# Patient Record
Sex: Female | Born: 1995 | Race: White | Hispanic: No | Marital: Single | Smoking: Former smoker
Health system: Southern US, Community
[De-identification: ages and names within clinical notes are randomized; demographics above are authoritative.]

## PROBLEM LIST (undated history)

## (undated) ENCOUNTER — Emergency Department: Admission: EM

## (undated) ENCOUNTER — Inpatient Hospital Stay: Payer: Self-pay

## (undated) DIAGNOSIS — F329 Major depressive disorder, single episode, unspecified: Secondary | ICD-10-CM

## (undated) DIAGNOSIS — O139 Gestational [pregnancy-induced] hypertension without significant proteinuria, unspecified trimester: Secondary | ICD-10-CM

## (undated) DIAGNOSIS — F32A Depression, unspecified: Secondary | ICD-10-CM

## (undated) DIAGNOSIS — D649 Anemia, unspecified: Secondary | ICD-10-CM

## (undated) HISTORY — PX: NO PAST SURGERIES: SHX2092

---
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
## 2015-08-04 NOTE — L&D Delivery Note (Signed)
Delivery Note At 2233 on 07/01/16  a viable and female  was delivered via  SVD meconium staining noted  And infant was vigorous at birth  (Presentation:ROA ;  ).  APGAR8/9: , ; weight  .   Placenta status:intact and meconium stained  , .  Cord:  dalayed clamping  with the following complications: None.    Anesthesia:CLE   Episiotomy:  none Lacerations:  Anterior vaginal lac 2 cm  Suture Repair: 3.0 vicryl Est. Blood Loss (mL):  200 cc  Mom to postpartum.  Baby to Couplet care / Skin to Skin.  Fuad Forget 07/01/2016, 10:43 PM

## 2016-04-15 ENCOUNTER — Observation Stay: Payer: No Typology Code available for payment source

## 2016-04-15 ENCOUNTER — Observation Stay
Admission: EM | Admit: 2016-04-15 | Discharge: 2016-04-15 | Disposition: A | Discharge status: Home / Self Care | Payer: No Typology Code available for payment source | Attending: Obstetrics and Gynecology | Admitting: Obstetrics and Gynecology

## 2016-04-15 DIAGNOSIS — O0933 Supervision of pregnancy with insufficient antenatal care, third trimester: Secondary | ICD-10-CM | POA: Insufficient documentation

## 2016-04-15 DIAGNOSIS — Z3A28 28 weeks gestation of pregnancy: Secondary | ICD-10-CM | POA: Diagnosis not present

## 2016-04-15 DIAGNOSIS — O99333 Smoking (tobacco) complicating pregnancy, third trimester: Secondary | ICD-10-CM | POA: Insufficient documentation

## 2016-04-15 DIAGNOSIS — O0932 Supervision of pregnancy with insufficient antenatal care, second trimester: Secondary | ICD-10-CM

## 2016-04-15 DIAGNOSIS — Z3490 Encounter for supervision of normal pregnancy, unspecified, unspecified trimester: Secondary | ICD-10-CM

## 2016-04-15 LAB — DIFFERENTIAL
BASOS ABS: 0 10*3/uL (ref 0–0.1)
BASOS PCT: 0 %
EOS ABS: 0.1 10*3/uL (ref 0–0.7)
Eosinophils Relative: 1 %
Lymphocytes Relative: 17 %
Lymphs Abs: 2.2 10*3/uL (ref 1.0–3.6)
MONO ABS: 0.9 10*3/uL (ref 0.2–0.9)
MONOS PCT: 7 %
NEUTROS ABS: 10 10*3/uL — AB (ref 1.4–6.5)
Neutrophils Relative %: 75 %

## 2016-04-15 LAB — URINALYSIS COMPLETE WITH MICROSCOPIC (ARMC ONLY)
BACTERIA UA: NONE SEEN
Bilirubin Urine: NEGATIVE
Glucose, UA: NEGATIVE mg/dL
HGB URINE DIPSTICK: NEGATIVE
Leukocytes, UA: NEGATIVE
NITRITE: NEGATIVE
PH: 7 (ref 5.0–8.0)
PROTEIN: NEGATIVE mg/dL
SPECIFIC GRAVITY, URINE: 1.015 (ref 1.005–1.030)

## 2016-04-15 LAB — CBC
HCT: 33.3 % — ABNORMAL LOW (ref 35.0–47.0)
Hemoglobin: 11.3 g/dL — ABNORMAL LOW (ref 12.0–16.0)
MCH: 31.3 pg (ref 26.0–34.0)
MCHC: 33.9 g/dL (ref 32.0–36.0)
MCV: 92.2 fL (ref 80.0–100.0)
PLATELETS: 234 10*3/uL (ref 150–440)
RBC: 3.61 MIL/uL — ABNORMAL LOW (ref 3.80–5.20)
RDW: 12.6 % (ref 11.5–14.5)
WBC: 13.2 10*3/uL — AB (ref 3.6–11.0)

## 2016-04-15 LAB — TYPE AND SCREEN
ABO/RH(D): AB POS
Antibody Screen: NEGATIVE

## 2016-04-15 LAB — URINE DRUG SCREEN, QUALITATIVE (ARMC ONLY)
Amphetamines, Ur Screen: NOT DETECTED
BARBITURATES, UR SCREEN: NOT DETECTED
BENZODIAZEPINE, UR SCRN: NOT DETECTED
CANNABINOID 50 NG, UR ~~LOC~~: POSITIVE — AB
Cocaine Metabolite,Ur ~~LOC~~: NOT DETECTED
MDMA (ECSTASY) UR SCREEN: NOT DETECTED
Methadone Scn, Ur: NOT DETECTED
Opiate, Ur Screen: NOT DETECTED
PHENCYCLIDINE (PCP) UR S: NOT DETECTED
TRICYCLIC, UR SCREEN: NOT DETECTED

## 2016-04-15 LAB — RAPID HIV SCREEN (HIV 1/2 AB+AG)
HIV 1/2 Antibodies: NONREACTIVE
HIV-1 P24 ANTIGEN - HIV24: NONREACTIVE

## 2016-04-15 LAB — OB RESULTS CONSOLE HIV ANTIBODY (ROUTINE TESTING): HIV: NONREACTIVE

## 2016-04-15 LAB — FETAL FIBRONECTIN: FETAL FIBRONECTIN: NEGATIVE
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
## 2016-04-15 NOTE — Discharge Instructions (Signed)
Fetal Movement Counts °Patient Name: __________________________________________________ Patient Due Date: ____________________ °Performing a fetal movement count is highly recommended in high-risk pregnancies, but it is good for every pregnant woman to do. Your health care provider may ask you to start counting fetal movements at 28 weeks of the pregnancy. Fetal movements often increase: °· After eating a full meal. °· After physical activity. °· After eating or drinking something sweet or cold. °· At rest. °Pay attention to when you feel the baby is most active. This will help you notice a pattern of your baby's sleep and wake cycles and what factors contribute to an increase in fetal movement. It is important to perform a fetal movement count at the same time each day when your baby is normally most active.  °HOW TO COUNT FETAL MOVEMENTS °1. Find a quiet and comfortable area to sit or lie down on your left side. Lying on your left side provides the best blood and oxygen circulation to your baby. °2. Write down the day and time on a sheet of paper or in a journal. °3. Start counting kicks, flutters, swishes, rolls, or jabs in a 2-hour period. You should feel at least 10 movements within 2 hours. °4. If you do not feel 10 movements in 2 hours, wait 2-3 hours and count again. Look for a change in the pattern or not enough counts in 2 hours. °SEEK MEDICAL CARE IF: °· You feel less than 10 counts in 2 hours, tried twice. °· There is no movement in over an hour. °· The pattern is changing or taking longer each day to reach 10 counts in 2 hours. °· You feel the baby is not moving as he or she usually does. °Date: ____________ Movements: ____________ Start time: ____________ Finish time: ____________  °Date: ____________ Movements: ____________ Start time: ____________ Finish time: ____________ °Date: ____________ Movements: ____________ Start time: ____________ Finish time: ____________ °Date: ____________ Movements:  ____________ Start time: ____________ Finish time: ____________ °Date: ____________ Movements: ____________ Start time: ____________ Finish time: ____________ °Date: ____________ Movements: ____________ Start time: ____________ Finish time: ____________ °Date: ____________ Movements: ____________ Start time: ____________ Finish time: ____________ °Date: ____________ Movements: ____________ Start time: ____________ Finish time: ____________  °Date: ____________ Movements: ____________ Start time: ____________ Finish time: ____________ °Date: ____________ Movements: ____________ Start time: ____________ Finish time: ____________ °Date: ____________ Movements: ____________ Start time: ____________ Finish time: ____________ °Date: ____________ Movements: ____________ Start time: ____________ Finish time: ____________ °Date: ____________ Movements: ____________ Start time: ____________ Finish time: ____________ °Date: ____________ Movements: ____________ Start time: ____________ Finish time: ____________ °Date: ____________ Movements: ____________ Start time: ____________ Finish time: ____________  °Date: ____________ Movements: ____________ Start time: ____________ Finish time: ____________ °Date: ____________ Movements: ____________ Start time: ____________ Finish time: ____________ °Date: ____________ Movements: ____________ Start time: ____________ Finish time: ____________ °Date: ____________ Movements: ____________ Start time: ____________ Finish time: ____________ °Date: ____________ Movements: ____________ Start time: ____________ Finish time: ____________ °Date: ____________ Movements: ____________ Start time: ____________ Finish time: ____________ °Date: ____________ Movements: ____________ Start time: ____________ Finish time: ____________  °Date: ____________ Movements: ____________ Start time: ____________ Finish time: ____________ °Date: ____________ Movements: ____________ Start time: ____________ Finish  time: ____________ °Date: ____________ Movements: ____________ Start time: ____________ Finish time: ____________ °Date: ____________ Movements: ____________ Start time: ____________ Finish time: ____________ °Date: ____________ Movements: ____________ Start time: ____________ Finish time: ____________ °Date: ____________ Movements: ____________ Start time: ____________ Finish time: ____________ °Date: ____________ Movements: ____________ Start time: ____________ Finish time: ____________  °Date: ____________ Movements: ____________ Start time: ____________ Finish   time: ____________ °Date: ____________ Movements: ____________ Start time: ____________ Finish time: ____________ °Date: ____________ Movements: ____________ Start time: ____________ Finish time: ____________ °Date: ____________ Movements: ____________ Start time: ____________ Finish time: ____________ °Date: ____________ Movements: ____________ Start time: ____________ Finish time: ____________ °Date: ____________ Movements: ____________ Start time: ____________ Finish time: ____________ °Date: ____________ Movements: ____________ Start time: ____________ Finish time: ____________  °Date: ____________ Movements: ____________ Start time: ____________ Finish time: ____________ °Date: ____________ Movements: ____________ Start time: ____________ Finish time: ____________ °Date: ____________ Movements: ____________ Start time: ____________ Finish time: ____________ °Date: ____________ Movements: ____________ Start time: ____________ Finish time: ____________ °Date: ____________ Movements: ____________ Start time: ____________ Finish time: ____________ °Date: ____________ Movements: ____________ Start time: ____________ Finish time: ____________ °Date: ____________ Movements: ____________ Start time: ____________ Finish time: ____________  °Date: ____________ Movements: ____________ Start time: ____________ Finish time: ____________ °Date: ____________  Movements: ____________ Start time: ____________ Finish time: ____________ °Date: ____________ Movements: ____________ Start time: ____________ Finish time: ____________ °Date: ____________ Movements: ____________ Start time: ____________ Finish time: ____________ °Date: ____________ Movements: ____________ Start time: ____________ Finish time: ____________ °Date: ____________ Movements: ____________ Start time: ____________ Finish time: ____________ °Date: ____________ Movements: ____________ Start time: ____________ Finish time: ____________  °Date: ____________ Movements: ____________ Start time: ____________ Finish time: ____________ °Date: ____________ Movements: ____________ Start time: ____________ Finish time: ____________ °Date: ____________ Movements: ____________ Start time: ____________ Finish time: ____________ °Date: ____________ Movements: ____________ Start time: ____________ Finish time: ____________ °Date: ____________ Movements: ____________ Start time: ____________ Finish time: ____________ °Date: ____________ Movements: ____________ Start time: ____________ Finish time: ____________ °  °This information is not intended to replace advice given to you by your health care provider. Make sure you discuss any questions you have with your health care provider. °  °Document Released: 08/19/2006 Document Revised: 08/10/2014 Document Reviewed: 05/16/2012 °[employer]vier Interactive Patient Education ©2016 [employer] °Third Trimester of Pregnancy °The third trimester is from week 29 through week 42, months 7 through 9. The third trimester is a time when the fetus is growing rapidly. At the end of the ninth month, the fetus is about 20 inches in length and weighs 6-10 pounds.  °BODY CHANGES °Your body goes through many changes during pregnancy. The changes vary from woman to woman.  °· Your weight will continue to increase. You can expect to gain 25-35 pounds (11-16 kg) by the end of the pregnancy. °· You may  begin to get stretch marks on your hips, abdomen, and breasts. °· You may urinate more often because the fetus is moving lower into your pelvis and pressing on your bladder. °· You may develop or continue to have heartburn as a result of your pregnancy. °· You may develop constipation because certain hormones are causing the muscles that push waste through your intestines to slow down. °· You may develop hemorrhoids or swollen, bulging veins (varicose veins). °· You may have pelvic pain because of the weight gain and pregnancy hormones relaxing your joints between the bones in your pelvis. Backaches may result from overexertion of the muscles supporting your posture. °· You may have changes in your hair. These can include thickening of your hair, rapid growth, and changes in texture. Some women also have hair loss during or after pregnancy, or hair that feels dry or thin. Your hair will most likely return to normal after your baby is born. °· Your breasts will continue to grow and be tender. A yellow discharge may leak from your breasts called colostrum. °· Your belly button may stick out. °·   You may feel short of breath because of your expanding uterus. °· You may notice the fetus "dropping," or moving lower in your abdomen. °· You may have a bloody mucus discharge. This usually occurs a few days to a week before labor begins. °· Your cervix becomes thin and soft (effaced) near your due date. °WHAT TO EXPECT AT YOUR PRENATAL EXAMS  °You will have prenatal exams every 2 weeks until week 36. Then, you will have weekly prenatal exams. During a routine prenatal visit: °· You will be weighed to make sure you and the fetus are growing normally. °· Your blood pressure is taken. °· Your abdomen will be measured to track your baby's growth. °· The fetal heartbeat will be listened to. °· Any test results from the previous visit will be discussed. °· You may have a cervical check near your due date to see if you have  effaced. °At around 36 weeks, your caregiver will check your cervix. At the same time, your caregiver will also perform a test on the secretions of the vaginal tissue. This test is to determine if a type of bacteria, Group B streptococcus, is present. Your caregiver will explain this further. °Your caregiver may ask you: °· What your birth plan is. °· How you are feeling. °· If you are feeling the baby move. °· If you have had any abnormal symptoms, such as leaking fluid, bleeding, severe headaches, or abdominal cramping. °· If you are using any tobacco products, including cigarettes, chewing tobacco, and electronic cigarettes. °· If you have any questions. °Other tests or screenings that may be performed during your third trimester include: °· Blood tests that check for low iron levels (anemia). °· Fetal testing to check the health, activity level, and growth of the fetus. Testing is done if you have certain medical conditions or if there are problems during the pregnancy. °· HIV (human immunodeficiency virus) testing. If you are at high risk, you may be screened for HIV during your third trimester of pregnancy. °FALSE LABOR °You may feel small, irregular contractions that eventually go away. These are called Braxton Hicks contractions, or false labor. Contractions may last for hours, days, or even weeks before true labor sets in. If contractions come at regular intervals, intensify, or become painful, it is best to be seen by your caregiver.  °SIGNS OF LABOR  °· Menstrual-like cramps. °· Contractions that are 5 minutes apart or less. °· Contractions that start on the top of the uterus and spread down to the lower abdomen and back. °· A sense of increased pelvic pressure or back pain. °· A watery or bloody mucus discharge that comes from the vagina. °If you have any of these signs before the 37th week of pregnancy, call your caregiver right away. You need to go to the hospital to get checked immediately. °HOME CARE  INSTRUCTIONS  °· Avoid all smoking, herbs, alcohol, and unprescribed drugs. These chemicals affect the formation and growth of the baby. °· Do not use any tobacco products, including cigarettes, chewing tobacco, and electronic cigarettes. If you need help quitting, ask your health care provider. You may receive counseling support and other resources to help you quit. °· Follow your caregiver's instructions regarding medicine use. There are medicines that are either safe or unsafe to take during pregnancy. °· Exercise only as directed by your caregiver. Experiencing uterine cramps is a good sign to stop exercising. °· Continue to eat regular, healthy meals. °· Wear a good support bra for   breast tenderness. °· Do not use hot tubs, steam rooms, or saunas. °· Wear your seat belt at all times when driving. °· Avoid raw meat, uncooked cheese, cat litter boxes, and soil used by cats. These carry germs that can cause birth defects in the baby. °· Take your prenatal vitamins. °· Take 1500-2000 mg of calcium daily starting at the 20th week of pregnancy until you deliver your baby. °· Try taking a stool softener (if your caregiver approves) if you develop constipation. Eat more high-fiber foods, such as fresh vegetables or fruit and whole grains. Drink plenty of fluids to keep your urine clear or pale yellow. °· Take warm sitz baths to soothe any pain or discomfort caused by hemorrhoids. Use hemorrhoid cream if your caregiver approves. °· If you develop varicose veins, wear support hose. Elevate your feet for 15 minutes, 3-4 times a day. Limit salt in your diet. °· Avoid heavy lifting, wear low heal shoes, and practice good posture. °· Rest a lot with your legs elevated if you have leg cramps or low back pain. °· Visit your dentist if you have not gone during your pregnancy. Use a soft toothbrush to brush your teeth and be gentle when you floss. °· A sexual relationship may be continued unless your caregiver directs you  otherwise. °· Do not travel far distances unless it is absolutely necessary and only with the approval of your caregiver. °· Take prenatal classes to understand, practice, and ask questions about the labor and delivery. °· Make a trial run to the hospital. °· Pack your hospital bag. °· Prepare the baby's nursery. °· Continue to go to all your prenatal visits as directed by your caregiver. °SEEK MEDICAL CARE IF: °· You are unsure if you are in labor or if your water has broken. °· You have dizziness. °· You have mild pelvic cramps, pelvic pressure, or nagging pain in your abdominal area. °· You have persistent nausea, vomiting, or diarrhea. °· You have a bad smelling vaginal discharge. °· You have pain with urination. °SEEK IMMEDIATE MEDICAL CARE IF:  °· You have a fever. °· You are leaking fluid from your vagina. °· You have spotting or bleeding from your vagina. °· You have severe abdominal cramping or pain. °· You have rapid weight loss or gain. °· You have shortness of breath with chest pain. °· You notice sudden or extreme swelling of your face, hands, ankles, feet, or legs. °· You have not felt your baby move in over an hour. °· You have severe headaches that do not go away with medicine. °· You have vision changes. °  °This information is not intended to replace advice given to you by your health care provider. Make sure you discuss any questions you have with your health care provider. °  °Document Released: 07/14/2001 Document Revised: 08/10/2014 Document Reviewed: 09/20/2012 °Elsevier Interactive Patient Education ©2016 [employer] ° °

## 2016-04-15 NOTE — Plan of Care (Signed)
Pt back from ultrasound. EFM reapplied. Lab here to draw labs. Pt asking for something to eat
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
## 2016-04-15 NOTE — Progress Notes (Signed)
Monitor strip reviewed by m sigmon,cnm regarding contractions noted on monitor after pt coming back to unit after ultrasound.
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
## 2016-04-16 LAB — HEPATITIS B SURFACE ANTIGEN: Hepatitis B Surface Ag: NEGATIVE

## 2016-04-16 LAB — VARICELLA ZOSTER ANTIBODY, IGG: VARICELLA IGG: 1226 {index} (ref >165)

## 2016-04-16 LAB — RPR: RPR Ser Ql: NONREACTIVE

## 2016-04-16 LAB — RUBELLA SCREEN: Rubella: 1.1 index (ref >0.99)
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
## 2016-04-18 ENCOUNTER — Encounter: Payer: Self-pay | Admitting: *Deleted

## 2016-04-18 ENCOUNTER — Observation Stay
Admission: EM | Admit: 2016-04-18 | Discharge: 2016-04-18 | Disposition: A | Discharge status: Home / Self Care | Payer: No Typology Code available for payment source | Attending: Obstetrics & Gynecology | Admitting: Obstetrics & Gynecology

## 2016-04-18 DIAGNOSIS — Y939 Activity, unspecified: Secondary | ICD-10-CM | POA: Diagnosis not present

## 2016-04-18 DIAGNOSIS — Y999 Unspecified external cause status: Secondary | ICD-10-CM | POA: Diagnosis not present

## 2016-04-18 DIAGNOSIS — Y929 Unspecified place or not applicable: Secondary | ICD-10-CM | POA: Diagnosis not present

## 2016-04-18 DIAGNOSIS — Z331 Pregnant state, incidental: Secondary | ICD-10-CM | POA: Insufficient documentation

## 2016-04-18 DIAGNOSIS — R102 Pelvic and perineal pain: Principal | ICD-10-CM | POA: Insufficient documentation
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
## 2016-05-23 ENCOUNTER — Encounter: Payer: Self-pay | Admitting: *Deleted

## 2016-05-23 ENCOUNTER — Observation Stay
Admission: EM | Admit: 2016-05-23 | Discharge: 2016-05-23 | Disposition: A | Discharge status: Home / Self Care | Payer: Medicaid Other | Attending: Obstetrics and Gynecology | Admitting: Obstetrics and Gynecology

## 2016-05-23 DIAGNOSIS — F17210 Nicotine dependence, cigarettes, uncomplicated: Secondary | ICD-10-CM | POA: Diagnosis not present

## 2016-05-23 DIAGNOSIS — M79605 Pain in left leg: Secondary | ICD-10-CM | POA: Diagnosis not present

## 2016-05-23 DIAGNOSIS — O99333 Smoking (tobacco) complicating pregnancy, third trimester: Secondary | ICD-10-CM | POA: Insufficient documentation

## 2016-05-23 DIAGNOSIS — R109 Unspecified abdominal pain: Secondary | ICD-10-CM | POA: Diagnosis not present

## 2016-05-23 DIAGNOSIS — Z79899 Other long term (current) drug therapy: Secondary | ICD-10-CM | POA: Diagnosis not present

## 2016-05-23 DIAGNOSIS — O26893 Other specified pregnancy related conditions, third trimester: Secondary | ICD-10-CM | POA: Diagnosis not present

## 2016-05-23 DIAGNOSIS — M25552 Pain in left hip: Secondary | ICD-10-CM | POA: Diagnosis not present

## 2016-05-23 DIAGNOSIS — Z3A34 34 weeks gestation of pregnancy: Secondary | ICD-10-CM | POA: Insufficient documentation

## 2016-05-23 DIAGNOSIS — M79604 Pain in right leg: Secondary | ICD-10-CM | POA: Insufficient documentation

## 2016-05-23 LAB — URINALYSIS COMPLETE WITH MICROSCOPIC (ARMC ONLY)
Bilirubin Urine: NEGATIVE
Glucose, UA: NEGATIVE mg/dL
Ketones, ur: NEGATIVE mg/dL
Nitrite: NEGATIVE
PROTEIN: 30 mg/dL — AB
SPECIFIC GRAVITY, URINE: 1.008 (ref 1.005–1.030)
pH: 7 (ref 5.0–8.0)

## 2016-05-23 LAB — URINE DRUG SCREEN, QUALITATIVE (ARMC ONLY)
AMPHETAMINES, UR SCREEN: NOT DETECTED
Barbiturates, Ur Screen: NOT DETECTED
Benzodiazepine, Ur Scrn: NOT DETECTED
CANNABINOID 50 NG, UR ~~LOC~~: NOT DETECTED
Cocaine Metabolite,Ur ~~LOC~~: NOT DETECTED
MDMA (ECSTASY) UR SCREEN: NOT DETECTED
METHADONE SCREEN, URINE: NOT DETECTED
Opiate, Ur Screen: NOT DETECTED
Phencyclidine (PCP) Ur S: NOT DETECTED
TRICYCLIC, UR SCREEN: NOT DETECTED

## 2016-05-23 MED ORDER — ACETAMINOPHEN 325 MG PO TABS: 650.0000 mg | ORAL_TABLET | ORAL | Status: DC | PRN

## 2016-05-23 MED ORDER — LACTATED RINGERS IV SOLN: 500.0000 mL | INTRAVENOUS | Status: DC | PRN

## 2016-05-23 MED ORDER — OXYCODONE-ACETAMINOPHEN 5-325 MG PO TABS: 1.0000 | ORAL_TABLET | ORAL | Status: DC | PRN
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
## 2016-05-23 NOTE — OB Triage Note (Signed)
Pt complains of intermittent, lower abdominal pain rates 4/10.  Pt also complains of headache and feeling of weakness in her legs. She states that she has increased white discharge All symptoms presented around 2pm today.

## 2016-05-23 NOTE — Progress Notes (Signed)
Urine results WNL and will dc home to rest.  FU with Labor Signs and Symptoms.    Results for Audrey Beard, Audrey Beard (MRN [id number]) as of 05/23/2016 19:44  Ref. Range 05/23/2016 18:36  Appearance Latest Ref Range: CLEAR  CLEAR (Beard)  Bacteria, UA Latest Ref Range: NONE SEEN  MANY (Beard)  Bilirubin Urine Latest Ref Range: NEGATIVE  NEGATIVE  Color, Urine Latest Ref Range: YELLOW  YELLOW (Beard)  Glucose Latest Ref Range: NEGATIVE mg/dL NEGATIVE  Hgb urine dipstick Latest Ref Range: NEGATIVE  1+ (Beard)  Hyaline Casts, UA Unknown PRESENT  Ketones, ur Latest Ref Range: NEGATIVE mg/dL NEGATIVE  Leukocytes, UA Latest Ref Range: NEGATIVE  TRACE (Beard)  Mucous Unknown PRESENT  Nitrite Latest Ref Range: NEGATIVE  NEGATIVE  pH Latest Ref Range: 5.0 - 8.0  7.0  Protein Latest Ref Range: NEGATIVE mg/dL 30 (Beard)  RBC / HPF Latest Ref Range: 0 - 5 RBC/hpf 6-30  Specific Gravity, Urine Latest Ref Range: 1.005 - 1.030  1.008  Squamous Epithelial / LPF Latest Ref Range: NONE SEEN  6-30 (Beard)  WBC, UA Latest Ref Range: 0 - 5 WBC/hpf 0-5  Amphetamines, Ur Screen Latest Ref Range: NONE DETECTED  NONE DETECTED  Barbiturates, Ur Screen Latest Ref Range: NONE DETECTED  NONE DETECTED  Benzodiazepine, Ur Scrn Latest Ref Range: NONE DETECTED  NONE DETECTED  Cocaine Metabolite,Ur [place] Latest Ref Range: NONE DETECTED  NONE DETECTED  Methadone Scn, Ur Latest Ref Range: NONE DETECTED  NONE DETECTED  MDMA (Ecstasy)Ur Screen Latest Ref Range: NONE DETECTED  NONE DETECTED  Cannabinoid 50 Ng, Ur [place] Latest Ref Range: NONE DETECTED  NONE DETECTED  Opiate, Ur Screen Latest Ref Range: NONE DETECTED  NONE DETECTED  Phencyclidine (PCP) Ur S Latest Ref Range: NONE DETECTED  NONE DETECTED  Tricyclic, Ur Screen Latest Ref Range: NONE DETECTED  NONE DETECTED

## 2016-05-23 NOTE — Discharge Summary (Signed)
Patient discharged with instructions on labor precautions, oral hydration, follow up appointment, and when to seek medical attention. Patient ambulatory at discharge with steady gait and no complaints. Patient discharged with family.

## 2016-05-26 LAB — HSV CULTURE AND TYPING

## 2016-05-31 DIAGNOSIS — B009 Herpesviral infection, unspecified: Secondary | ICD-10-CM | POA: Insufficient documentation

## 2016-06-22 ENCOUNTER — Encounter: Payer: Self-pay | Admitting: *Deleted

## 2016-06-22 ENCOUNTER — Observation Stay
Admission: EM | Admit: 2016-06-22 | Discharge: 2016-06-22 | Disposition: A | Discharge status: Home / Self Care | Payer: Medicaid Other | Attending: Obstetrics & Gynecology | Admitting: Obstetrics & Gynecology

## 2016-06-22 DIAGNOSIS — F17210 Nicotine dependence, cigarettes, uncomplicated: Secondary | ICD-10-CM | POA: Insufficient documentation

## 2016-06-22 DIAGNOSIS — F329 Major depressive disorder, single episode, unspecified: Secondary | ICD-10-CM | POA: Insufficient documentation

## 2016-06-22 DIAGNOSIS — O26893 Other specified pregnancy related conditions, third trimester: Secondary | ICD-10-CM | POA: Diagnosis present

## 2016-06-22 DIAGNOSIS — M549 Dorsalgia, unspecified: Secondary | ICD-10-CM | POA: Diagnosis present

## 2016-06-22 DIAGNOSIS — R102 Pelvic and perineal pain: Secondary | ICD-10-CM | POA: Insufficient documentation

## 2016-06-22 DIAGNOSIS — O2343 Unspecified infection of urinary tract in pregnancy, third trimester: Secondary | ICD-10-CM | POA: Insufficient documentation

## 2016-06-22 DIAGNOSIS — O99333 Smoking (tobacco) complicating pregnancy, third trimester: Secondary | ICD-10-CM | POA: Diagnosis not present

## 2016-06-22 DIAGNOSIS — O99343 Other mental disorders complicating pregnancy, third trimester: Secondary | ICD-10-CM | POA: Insufficient documentation

## 2016-06-22 DIAGNOSIS — Z3A38 38 weeks gestation of pregnancy: Secondary | ICD-10-CM | POA: Insufficient documentation

## 2016-06-22 DIAGNOSIS — O9989 Other specified diseases and conditions complicating pregnancy, childbirth and the puerperium: Secondary | ICD-10-CM

## 2016-06-22 DIAGNOSIS — O99891 Other specified diseases and conditions complicating pregnancy: Secondary | ICD-10-CM | POA: Diagnosis present

## 2016-06-22 HISTORY — DX: Depression, unspecified: F32.A

## 2016-06-22 HISTORY — DX: Major depressive disorder, single episode, unspecified: F32.9

## 2016-06-22 LAB — URINALYSIS COMPLETE WITH MICROSCOPIC (ARMC ONLY)
Bilirubin Urine: NEGATIVE
Glucose, UA: 50 mg/dL — AB
LEUKOCYTES UA: NEGATIVE
Nitrite: POSITIVE — AB
PH: 7 (ref 5.0–8.0)
PROTEIN: 30 mg/dL — AB
Specific Gravity, Urine: 1.011 (ref 1.005–1.030)

## 2016-06-22 MED ORDER — ACETAMINOPHEN 500 MG PO TABS: 1000.0000 mg | ORAL_TABLET | Freq: Four times a day (QID) | ORAL | Status: DC | PRN

## 2016-06-22 MED ORDER — CEPHALEXIN 500 MG PO CAPS: 500.0000 mg | ORAL_CAPSULE | Freq: Four times a day (QID) | ORAL | 0 refills | Status: DC

## 2016-06-22 NOTE — OB Triage Note (Signed)
Patient complains of lower abdominal pressure 6/10 that started around 0430-0530 this morning that is constant and back pain that she has had foe weeks. Pt denies any other complaints such as LOF, contractions, or bleeding. Pt states baby is moving well.

## 2016-06-22 NOTE — Discharge Instructions (Signed)
Drink plenty of fluid and get plenty of rest. Take all of your prescription as directed. Call your provider for any other concerns.

## 2016-06-22 NOTE — Discharge Summary (Signed)
Patient discharged home, discharge instructions given, patient states understanding. Patient left floor in stable condition, denies any other needs at this time. Patient next appointment 06/29/16 at 1345 with Dr ward

## 2016-06-23 NOTE — Discharge Summary (Signed)
Audrey Beard is a 20 y.o. female. G2P1001 She is at 7451w5d gestation.  Chief Complaint: pelvic pain  S: Resting comfortably. rare CTX, no VB.no LOF,  Active fetal movement.   Location: low pelvis Context: patient having low anterior pelvic pain, bilateral, +FM rare CTX no LOF VB Onset/timing/duration: 2-3 days Quality: dull achy Severity: moderate Aggravating factors: nothing Alleviating factors: nothing Associated signs/symptoms: see above   Maternal Medical History:   Past Medical History:  Diagnosis Date  . Depression     Past Surgical History:  Procedure Laterality Date  . NO PAST SURGERIES      No Known Allergies  Prior to Admission medications   Medication Sig Start Date End Date Taking? Authorizing Provider  valACYclovir (VALTREX) 1000 MG tablet Take 1,000 mg by mouth daily.   Yes Historical Provider, MD  cephALEXin (KEFLEX) 500 MG capsule Take 1 capsule (500 mg total) by mouth 4 (four) times daily. 06/22/16   Shirlena Brinegar C Kynsley Whitehouse, MD  Prenatal Vit-Fe Fumarate-FA (MULTIVITAMIN-PRENATAL) 27-0.8 MG TABS tablet Take 1 tablet by mouth daily at 12 noon.    Historical Provider, MD     [facility]  Social History: She  reports that she has been smoking Cigarettes.  She has a 0.50 pack-year smoking history. She has never used smokeless tobacco. She reports that she uses drugs, including Marijuana. She reports that she does not drink alcohol.  Family History: family history is not on file.   Review of Systems: A full review of systems was performed and negative except as noted in the HPI.     O:  LMP 09/26/2015 (Approximate)  Results for orders placed or performed during the hospital encounter of 06/22/16 (from the past 48 hour(s))  Urinalysis complete, with microscopic [facility] only)   Collection Time: 06/22/16  9:49 AM  Result Value Ref Range   Color, Urine YELLOW (A) YELLOW   APPearance CLEAR (A) CLEAR   Glucose, UA 50 (A) NEGATIVE mg/dL   Bilirubin Urine NEGATIVE NEGATIVE   Ketones, ur TRACE (A) NEGATIVE mg/dL   Specific Gravity, Urine 1.011 1.005 - 1.030   Hgb urine dipstick 1+ (A) NEGATIVE   pH 7.0 5.0 - 8.0   Protein, ur 30 (A) NEGATIVE mg/dL   Nitrite POSITIVE (A) NEGATIVE   Leukocytes, UA NEGATIVE NEGATIVE   RBC / HPF 0-5 0 - 5 RBC/hpf   WBC, UA 0-5 0 - 5 WBC/hpf   Bacteria, UA MANY (A) NONE SEEN   Squamous Epithelial / LPF 0-5 (A) NONE SEEN   Mucous PRESENT      Constitutional: NAD, AAOx3  HE/ENT: extraocular movements grossly intact, moist mucous membranes CV: RRR PULM: nl respiratory effort, CTABL     Abd: gravid, non-tender, non-distended, soft      Ext: Non-tender, Nonedmeatous   Psych: mood appropriate, speech normal Pelvic: 1/long/high  FHT: 125 mod + accels no decels TOCO: quiet    A/P:  20yo G2P1001 with UTI.   Labor: not present.   Keflex ordered for UTI, x 7 days  Fetal Wellbeing: Reassuring Cat 1 tracing.  D/c home stable, precautions reviewed, follow-up next week.   ----- Ranae Plumberhelsea Jillyn Stacey, MD Attending Obstetrician and Gynecologist [facility], Department of OB/GYN [facility]

## 2016-07-01 ENCOUNTER — Inpatient Hospital Stay: Payer: Medicaid Other | Admitting: Anesthesiology

## 2016-07-01 ENCOUNTER — Encounter: Payer: Self-pay | Admitting: *Deleted

## 2016-07-01 ENCOUNTER — Inpatient Hospital Stay
Admission: EM | Admit: 2016-07-01 | Discharge: 2016-07-03 | DRG: 775 | Disposition: A | Discharge status: Home / Self Care | Payer: Medicaid Other | Attending: Obstetrics and Gynecology | Admitting: Obstetrics and Gynecology

## 2016-07-01 DIAGNOSIS — D62 Acute posthemorrhagic anemia: Secondary | ICD-10-CM | POA: Diagnosis not present

## 2016-07-01 DIAGNOSIS — O134 Gestational [pregnancy-induced] hypertension without significant proteinuria, complicating childbirth: Secondary | ICD-10-CM | POA: Diagnosis present

## 2016-07-01 DIAGNOSIS — O9081 Anemia of the puerperium: Secondary | ICD-10-CM | POA: Diagnosis not present

## 2016-07-01 DIAGNOSIS — Z3A39 39 weeks gestation of pregnancy: Secondary | ICD-10-CM

## 2016-07-01 DIAGNOSIS — F17210 Nicotine dependence, cigarettes, uncomplicated: Secondary | ICD-10-CM | POA: Diagnosis present

## 2016-07-01 DIAGNOSIS — O479 False labor, unspecified: Secondary | ICD-10-CM | POA: Diagnosis present

## 2016-07-01 DIAGNOSIS — O99334 Smoking (tobacco) complicating childbirth: Secondary | ICD-10-CM | POA: Diagnosis present

## 2016-07-01 DIAGNOSIS — Z3493 Encounter for supervision of normal pregnancy, unspecified, third trimester: Secondary | ICD-10-CM | POA: Diagnosis present

## 2016-07-01 LAB — BASIC METABOLIC PANEL
Anion gap: 7 (ref 5–15)
BUN: 6 mg/dL (ref 6–20)
CHLORIDE: 113 mmol/L — AB (ref 101–111)
CO2: 19 mmol/L — AB (ref 22–32)
CREATININE: 0.55 mg/dL (ref 0.44–1.00)
Calcium: 8.9 mg/dL (ref 8.9–10.3)
GFR calc non Af Amer: >60 mL/min (ref >60)
Glucose, Bld: 88 mg/dL (ref 65–99)
POTASSIUM: 3.4 mmol/L — AB (ref 3.5–5.1)
SODIUM: 139 mmol/L (ref 135–145)

## 2016-07-01 LAB — HEPATIC FUNCTION PANEL
ALBUMIN: 2.8 g/dL — AB (ref 3.5–5.0)
ALK PHOS: 204 U/L — AB (ref 38–126)
ALT: 5 U/L — AB (ref 14–54)
AST: 19 U/L (ref 15–41)
BILIRUBIN TOTAL: 0.8 mg/dL (ref 0.3–1.2)
Bilirubin, Direct: 0.2 mg/dL (ref 0.1–0.5)
Indirect Bilirubin: 0.6 mg/dL (ref 0.3–0.9)
TOTAL PROTEIN: 6.5 g/dL (ref 6.5–8.1)

## 2016-07-01 LAB — PROTEIN / CREATININE RATIO, URINE
Creatinine, Urine: 64 mg/dL
PROTEIN CREATININE RATIO: 0.28 mg/mg{creat} — AB (ref 0.00–0.15)
Total Protein, Urine: 18 mg/dL

## 2016-07-01 LAB — CBC
HEMATOCRIT: 30.3 % — AB (ref 35.0–47.0)
Hemoglobin: 9.8 g/dL — ABNORMAL LOW (ref 12.0–16.0)
MCH: 25.3 pg — AB (ref 26.0–34.0)
MCHC: 32.2 g/dL (ref 32.0–36.0)
MCV: 78.4 fL — AB (ref 80.0–100.0)
PLATELETS: 255 10*3/uL (ref 150–440)
RBC: 3.87 MIL/uL (ref 3.80–5.20)
RDW: 17.8 % — AB (ref 11.5–14.5)
WBC: 11.8 10*3/uL — ABNORMAL HIGH (ref 3.6–11.0)

## 2016-07-01 LAB — TYPE AND SCREEN
ABO/RH(D): AB POS
Antibody Screen: NEGATIVE

## 2016-07-01 MED ORDER — FENTANYL 2.5 MCG/ML W/ROPIVACAINE 0.2% IN NS 100 ML EPIDURAL INFUSION (ARMC-ANES)
EPIDURAL | Status: DC | PRN
  Administered 2016-07-01: 10 mL/h via EPIDURAL

## 2016-07-01 MED ORDER — EPHEDRINE 5 MG/ML INJ
10.0000 mg | INTRAVENOUS | Status: DC | PRN
  Filled 2016-07-01: qty 2

## 2016-07-01 MED ORDER — PHENYLEPHRINE 40 MCG/ML (10ML) SYRINGE FOR IV PUSH (FOR BLOOD PRESSURE SUPPORT)
80.0000 ug | PREFILLED_SYRINGE | INTRAVENOUS | Status: DC | PRN
  Filled 2016-07-01: qty 5

## 2016-07-01 MED ORDER — OXYTOCIN BOLUS FROM INFUSION: 500.0000 mL | Freq: Once | INTRAVENOUS | Status: DC

## 2016-07-01 MED ORDER — FENTANYL 2.5 MCG/ML W/ROPIVACAINE 0.2% IN NS 100 ML EPIDURAL INFUSION (ARMC-ANES)
EPIDURAL | Status: AC
  Filled 2016-07-01: qty 100

## 2016-07-01 MED ORDER — LIDOCAINE HCL (PF) 1 % IJ SOLN: 30.0000 mL | INTRAMUSCULAR | Status: DC | PRN

## 2016-07-01 MED ORDER — OXYTOCIN 40 UNITS IN LACTATED RINGERS INFUSION - SIMPLE MED
INTRAVENOUS | Status: AC
  Filled 2016-07-01: qty 1000

## 2016-07-01 MED ORDER — OXYTOCIN 40 UNITS IN LACTATED RINGERS INFUSION - SIMPLE MED: 2.5000 [IU]/h | INTRAVENOUS | Status: DC

## 2016-07-01 MED ORDER — ONDANSETRON HCL 4 MG/2ML IJ SOLN
4.0000 mg | Freq: Four times a day (QID) | INTRAMUSCULAR | Status: DC | PRN
Start: 2016-07-01 — End: 2016-07-01

## 2016-07-01 MED ORDER — HYDROCODONE-ACETAMINOPHEN 5-325 MG PO TABS: 1.0000 | ORAL_TABLET | ORAL | Status: DC | PRN

## 2016-07-01 MED ORDER — AMMONIA AROMATIC IN INHA
RESPIRATORY_TRACT | Status: AC
  Filled 2016-07-01: qty 10

## 2016-07-01 MED ORDER — SOD CITRATE-CITRIC ACID 500-334 MG/5ML PO SOLN: 30.0000 mL | ORAL | Status: DC | PRN

## 2016-07-01 MED ORDER — BUTORPHANOL TARTRATE 1 MG/ML IJ SOLN
1.0000 mg | INTRAMUSCULAR | Status: DC | PRN
  Administered 2016-07-01 (×3): 1 mg via INTRAVENOUS
  Filled 2016-07-01 (×2): qty 1

## 2016-07-01 MED ORDER — ACETAMINOPHEN 325 MG PO TABS: 650.0000 mg | ORAL_TABLET | ORAL | Status: DC | PRN

## 2016-07-01 MED ORDER — OXYTOCIN 10 UNIT/ML IJ SOLN
INTRAMUSCULAR | Status: AC
  Filled 2016-07-01: qty 2

## 2016-07-01 MED ORDER — MISOPROSTOL 200 MCG PO TABS
ORAL_TABLET | ORAL | Status: AC
  Filled 2016-07-01: qty 4

## 2016-07-01 MED ORDER — BUPIVACAINE HCL (PF) 0.25 % IJ SOLN
INTRAMUSCULAR | Status: DC | PRN
  Administered 2016-07-01: 5 mL via EPIDURAL

## 2016-07-01 MED ORDER — OXYCODONE-ACETAMINOPHEN 5-325 MG PO TABS: 1.0000 | ORAL_TABLET | ORAL | Status: DC | PRN

## 2016-07-01 MED ORDER — LACTATED RINGERS IV SOLN
INTRAVENOUS | Status: DC
  Administered 2016-07-01: 1000 mL via INTRAVENOUS
  Administered 2016-07-01: 1 mL via INTRAVENOUS

## 2016-07-01 MED ORDER — FENTANYL 2.5 MCG/ML W/ROPIVACAINE 0.2% IN NS 100 ML EPIDURAL INFUSION (ARMC-ANES): 10.0000 mL/h | EPIDURAL | Status: DC

## 2016-07-01 MED ORDER — BUTORPHANOL TARTRATE 1 MG/ML IJ SOLN
2.0000 mg | INTRAMUSCULAR | Status: DC | PRN
  Filled 2016-07-01: qty 2

## 2016-07-01 MED ORDER — LACTATED RINGERS IV SOLN
500.0000 mL | INTRAVENOUS | Status: DC | PRN
Start: 2016-07-01 — End: 2016-07-02

## 2016-07-01 MED ORDER — LABETALOL HCL 5 MG/ML IV SOLN
20.0000 mg | INTRAVENOUS | Status: DC | PRN
  Administered 2016-07-01: 20 mg via INTRAVENOUS
  Filled 2016-07-01 (×2): qty 16

## 2016-07-01 MED ORDER — LACTATED RINGERS IV SOLN: INTRAVENOUS | Status: DC

## 2016-07-01 MED ORDER — ONDANSETRON HCL 4 MG/2ML IJ SOLN
4.0000 mg | Freq: Four times a day (QID) | INTRAMUSCULAR | Status: DC | PRN
Start: 2016-07-01 — End: 2016-07-02

## 2016-07-01 MED ORDER — HYDRALAZINE HCL 20 MG/ML IJ SOLN: 10.0000 mg | Freq: Once | INTRAMUSCULAR | Status: DC | PRN

## 2016-07-01 MED ORDER — LACTATED RINGERS IV SOLN: 500.0000 mL | INTRAVENOUS | Status: DC | PRN

## 2016-07-01 MED ORDER — LIDOCAINE HCL (PF) 1 % IJ SOLN
INTRAMUSCULAR | Status: AC
  Filled 2016-07-01: qty 30

## 2016-07-01 MED ORDER — LABETALOL HCL 5 MG/ML IV SOLN: 20.0000 mg | Freq: Once | INTRAVENOUS | Status: DC

## 2016-07-01 MED ORDER — LACTATED RINGERS IV SOLN: 500.0000 mL | Freq: Once | INTRAVENOUS | Status: DC

## 2016-07-01 MED ORDER — OXYCODONE-ACETAMINOPHEN 5-325 MG PO TABS: 2.0000 | ORAL_TABLET | ORAL | Status: DC | PRN

## 2016-07-01 MED ORDER — DIPHENHYDRAMINE HCL 50 MG/ML IJ SOLN: 12.5000 mg | INTRAMUSCULAR | Status: DC | PRN

## 2016-07-01 NOTE — Progress Notes (Signed)
Patient ID: Audrey Beard, female   DOB: [birth year], 20 y.o.   [id number] [facility] . Urine protein / cr ratio 0.28 .  Will tx for gestational HTN  No visible HSV lesion on exam . PT denies symptoms of an outbreak  Cle in place

## 2016-07-01 NOTE — Anesthesia Preprocedure Evaluation (Signed)
Anesthesia Evaluation  Patient identified by MRN, date of birth, ID band Patient awake    Reviewed: Allergy & Precautions, NPO status , Patient's Chart, lab work & pertinent test results, reviewed documented beta blocker date and time   Airway Mallampati: II  TM Distance: >3 FB     Dental  (+) Chipped   Pulmonary Current Smoker,           Cardiovascular      Neuro/Psych PSYCHIATRIC DISORDERS Depression    GI/Hepatic   Endo/Other    Renal/GU      Musculoskeletal   Abdominal   Peds  Hematology   Anesthesia Other Findings   Reproductive/Obstetrics                             Anesthesia Physical Anesthesia Plan  ASA: II  Anesthesia Plan: Epidural   Post-op Pain Management:    Induction:   Airway Management Planned:   Additional Equipment:   Intra-op Plan:   Post-operative Plan:   Informed Consent: I have reviewed the patients History and Physical, chart, labs and discussed the procedure including the risks, benefits and alternatives for the proposed anesthesia with the patient or authorized representative who has indicated his/her understanding and acceptance.     Plan Discussed with: CRNA  Anesthesia Plan Comments:         Anesthesia Quick Evaluation  
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
## 2016-07-01 NOTE — H&P (Signed)
Audrey Beard is a 20 y.o. female presenting for active labor  39+[redacted] weeks EGA . Marland Kitchen.poor PNC . H/o elevated Bp with last pregancy  OB History    Gravida Para Term Preterm AB Living   2 1 1     1    SAB TAB Ectopic Multiple Live Births           1     Past Medical History:  Diagnosis Date  . Depression    Past Surgical History:  Procedure Laterality Date  . NO PAST SURGERIES     Family History: family history is not on file. Social History:  reports that she has been smoking Cigarettes.  She has a 0.50 pack-year smoking history. She has never used smokeless tobacco. She reports that she uses drugs, including Marijuana. She reports that she does not drink alcohol.     Maternal Diabetes: Nonot checked  Genetic Screening: Declinednot done  Maternal Ultrasounds/Referrals: Normal Fetal Ultrasounds or other Referrals:  None Maternal Substance Abuse:  No Significant Maternal Medications:  None Significant Maternal Lab Results:  None Other Comments:  None  ROS History Dilation: 7 Effacement (%): 100 Station: 0 Exam by:: Audrey Beard Resp. rate 18, height 5\' 3"  (1.6 m), weight 165 lb (74.8 kg), last menstrual period 09/26/2015. Exam160/90's Physical Exam  Lungs CTA CV RRR  nst : reassuring + accels , no decels , CTX regular q1-2 min  AROM + meconium  Prenatal labs: ABO, Rh: --/--/AB POS (11/29 1706) Antibody: NEG (11/29 1706) Rubella: 1.10 (09/13 1754) RPR: Non Reactive (09/13 1754)  HBsAg: Negative (09/13 1754)  HIV: Non-reactive (09/13 0000)  GBS:   negative   Assessment/Plan: Term gestation , active labor  PIH nl waiting for P/Cr ratio If > 0.3 will start Magnesium   labetalol 20 mg iv x1 CLE requested    Audrey Beard 07/01/2016, 6:52 PM

## 2016-07-01 NOTE — Progress Notes (Signed)
Patient ID: Audrey Beard, female   DOB: 02/07/1996, 20 y.o.   MRN: [id number] Cle not working despite replacement .  Cx 8 cm  FSE place for difficulty picking up FHR

## 2016-07-01 NOTE — Anesthesia Procedure Notes (Signed)
Epidural Patient location during procedure: OB  Staffing Anesthesiologist: Berdine AddisonHOMAS, Nillie Bartolotta Performed: anesthesiologist   Preanesthetic Checklist Completed: patient identified, site marked, surgical consent, pre-op evaluation, timeout performed, IV checked, risks and benefits discussed and monitors and equipment checked  Epidural Patient position: sitting Prep: Betadine Patient monitoring: heart rate, continuous pulse ox and blood pressure Approach: midline Location: L4-L5 Injection technique: LOR saline  Needle:  Needle type: Tuohy  Needle gauge: 18 G Needle length: 9 cm and 9 Catheter type: closed end flexible Catheter size: 20 Guage Test dose: negative and 1.5% lidocaine with Epi 1:200 K  Assessment Sensory level: T10 Events: blood not aspirated, injection not painful, no injection resistance, negative IV test and no paresthesia  Additional Notes   Patient tolerated the insertion well without complications. 1908 test. 1915 infusion.Reason for block:procedure for pain

## 2016-07-01 NOTE — OB Triage Note (Signed)
Presents with complaint of lower abdominal pain that comes and goes about every 30 minutes. Denies leakage of fluid or bloody show.

## 2016-07-02 LAB — CBC
HCT: 27.9 % — ABNORMAL LOW (ref 35.0–47.0)
Hemoglobin: 8.8 g/dL — ABNORMAL LOW (ref 12.0–16.0)
MCH: 24.3 pg — AB (ref 26.0–34.0)
MCHC: 31.4 g/dL — AB (ref 32.0–36.0)
MCV: 77.6 fL — AB (ref 80.0–100.0)
PLATELETS: 206 10*3/uL (ref 150–440)
RBC: 3.6 MIL/uL — ABNORMAL LOW (ref 3.80–5.20)
RDW: 17.4 % — AB (ref 11.5–14.5)
WBC: 18.5 10*3/uL — ABNORMAL HIGH (ref 3.6–11.0)

## 2016-07-02 LAB — RUBELLA SCREEN: RUBELLA: 1.2 {index} (ref >0.99)

## 2016-07-02 LAB — RPR: RPR: NONREACTIVE

## 2016-07-02 MED ORDER — FERROUS SULFATE 325 (65 FE) MG PO TABS
325.0000 mg | ORAL_TABLET | Freq: Two times a day (BID) | ORAL | Status: DC
  Administered 2016-07-02 – 2016-07-03 (×4): 325 mg via ORAL
  Filled 2016-07-02 (×4): qty 1

## 2016-07-02 MED ORDER — SENNOSIDES-DOCUSATE SODIUM 8.6-50 MG PO TABS: 2.0000 | ORAL_TABLET | ORAL | Status: DC

## 2016-07-02 MED ORDER — MAGNESIUM HYDROXIDE 400 MG/5ML PO SUSP: 30.0000 mL | ORAL | Status: DC | PRN

## 2016-07-02 MED ORDER — IBUPROFEN 600 MG PO TABS
600.0000 mg | ORAL_TABLET | Freq: Four times a day (QID) | ORAL | Status: DC
  Administered 2016-07-02 – 2016-07-03 (×4): 600 mg via ORAL
  Filled 2016-07-02 (×6): qty 1

## 2016-07-02 MED ORDER — BENZOCAINE-MENTHOL 20-0.5 % EX AERO: 1.0000 "application " | INHALATION_SPRAY | CUTANEOUS | Status: DC | PRN

## 2016-07-02 MED ORDER — ACETAMINOPHEN 325 MG PO TABS
650.0000 mg | ORAL_TABLET | ORAL | Status: DC | PRN
Start: 2016-07-02 — End: 2016-07-03
  Administered 2016-07-02: 650 mg via ORAL
  Filled 2016-07-02 (×2): qty 2

## 2016-07-02 MED ORDER — INFLUENZA VAC SPLIT QUAD 0.5 ML IM SUSY
0.5000 mL | PREFILLED_SYRINGE | INTRAMUSCULAR | Status: AC
  Administered 2016-07-03: 0.5 mL via INTRAMUSCULAR
  Filled 2016-07-02: qty 0.5

## 2016-07-02 MED ORDER — DIPHENHYDRAMINE HCL 25 MG PO CAPS: 25.0000 mg | ORAL_CAPSULE | Freq: Four times a day (QID) | ORAL | Status: DC | PRN

## 2016-07-02 MED ORDER — WITCH HAZEL-GLYCERIN EX PADS: 1.0000 "application " | MEDICATED_PAD | CUTANEOUS | Status: DC | PRN

## 2016-07-02 MED ORDER — ONDANSETRON HCL 4 MG PO TABS: 4.0000 mg | ORAL_TABLET | ORAL | Status: DC | PRN

## 2016-07-02 MED ORDER — PRENATAL MULTIVITAMIN CH
1.0000 | ORAL_TABLET | Freq: Every day | ORAL | Status: DC
  Administered 2016-07-02 – 2016-07-03 (×2): 1 via ORAL
  Filled 2016-07-02 (×2): qty 1

## 2016-07-02 MED ORDER — MEASLES, MUMPS & RUBELLA VAC ~~LOC~~ INJ: 0.5000 mL | INJECTION | Freq: Once | SUBCUTANEOUS | Status: DC

## 2016-07-02 MED ORDER — ONDANSETRON HCL 4 MG/2ML IJ SOLN: 4.0000 mg | INTRAMUSCULAR | Status: DC | PRN

## 2016-07-02 MED ORDER — SIMETHICONE 80 MG PO CHEW: 80.0000 mg | CHEWABLE_TABLET | ORAL | Status: DC | PRN

## 2016-07-02 MED ORDER — ZOLPIDEM TARTRATE 5 MG PO TABS: 5.0000 mg | ORAL_TABLET | Freq: Every evening | ORAL | Status: DC | PRN

## 2016-07-02 MED ORDER — COCONUT OIL OIL: 1.0000 "application " | TOPICAL_OIL | Status: DC | PRN

## 2016-07-02 MED ORDER — DIBUCAINE 1 % RE OINT: 1.0000 "application " | TOPICAL_OINTMENT | RECTAL | Status: DC | PRN

## 2016-07-02 NOTE — Progress Notes (Signed)
PPD #1, SVD, baby boy   S:  Reports feeling good             Tolerating po/ No nausea or vomiting             Bleeding is light             Pain controlled with Ibuprofen             Up ad lib / ambulatory / voiding QS  Newborn formula feeding   Concerned about color changes on her outer thighs bilaterally - non-tender  O:               VS: BP 138/78 (BP Location: Left Arm)   Pulse 79   Temp 97.9 F (36.6 C) (Oral)   Resp 18   Ht 5\' 3"  (1.6 m)   Wt 74.8 kg (165 lb)   LMP 09/26/2015 (Approximate)   SpO2 99%   BMI 29.23 kg/m    LABS:              Recent Labs  07/01/16 1706 07/02/16 0443  WBC 11.8* 18.5*  HGB 9.8* 8.8*  PLT 255 206               Blood type: --/--/AB POS (11/29 1706)  Rubella: 1.10 (09/13 1754)                     I&O: Intake/Output      11/29 0701 - 11/30 0700 11/30 0701 - 12/01 0700   Urine (mL/kg/hr) 1200    Total Output 1200     Net -1200                        Physical Exam:             Alert and oriented X3  Lungs: Clear and unlabored  Heart: regular rate and rhythm / no mumurs  Abdomen: soft, non-tender, non-distended              Fundus: firm, non-tender, U-E  Perineum: well approximated 1st degree laceration, healing well, no significant erythema, no significant edema  Lochia: appropriate no clots  Extremities: mild BLE edema, no calf pain or tenderness, mild discoloration noted on outer thighs bilaterally, non-tender - continue to monitor     A: PPD # 1  Doing well - stable status  ABL Anemia    Gestational HTN - delivered, BPs stable   P: Routine post partum orders  On FE BID  Anticipate dc home tomorrow  Carlean JewsMeredith Sigmon, CNM

## 2016-07-02 NOTE — Progress Notes (Signed)
During my chart check I noticed labetalol was due at 1917. The patient was in the birthplace at that time. At 0104, after the patient was settled in mother baby, her BP was 135/70. Labetalol was not given at this time.

## 2016-07-02 NOTE — Anesthesia Postprocedure Evaluation (Signed)
Anesthesia Post Note  Patient: Audrey Beard  Procedure(s) Performed: * No procedures listed *  Patient location during evaluation: Mother Baby Anesthesia Type: Epidural Level of consciousness: awake and alert and oriented Pain management: pain level controlled Vital Signs Assessment: post-procedure vital signs reviewed and stable Respiratory status: spontaneous breathing Cardiovascular status: stable Postop Assessment: no headache and no signs of nausea or vomiting Anesthetic complications: no    Last Vitals:  Vitals:   07/02/16 0339 07/02/16 0812  BP: 127/85 138/78  Pulse: 96 79  Resp: 20 18  Temp: 37.1 C 36.6 C    Last Pain:  Vitals:   07/02/16 0812  TempSrc: Oral  PainSc:                  Rica MastBachich,  Ariella Voit M

## 2016-07-03 LAB — RPR: RPR Ser Ql: NONREACTIVE
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
## 2016-07-03 NOTE — Discharge Instructions (Signed)

## 2016-07-03 NOTE — Discharge Summary (Signed)
Imagene ShellerMegan Aldrich, RN reviewed all D/C instructions with patient during prior shift.  Patient waiting for MD to see her before being escorted down to car.  Patient D/C'd home by Dr. Dalbert GarnetBeasley.  Escorted via wheelchair by nursing staff.

## 2016-07-11 MED ORDER — ONDANSETRON HCL 4 MG/2ML IJ SOLN
INTRAMUSCULAR | Status: AC
  Filled 2016-07-11: qty 2

## 2017-01-20 ENCOUNTER — Encounter: Payer: Self-pay | Admitting: Medical Oncology

## 2017-01-20 ENCOUNTER — Emergency Department
Admission: EM | Admit: 2017-01-20 | Discharge: 2017-01-20 | Disposition: A | Discharge status: Home / Self Care | Payer: Medicaid Other | Attending: Emergency Medicine | Admitting: Emergency Medicine

## 2017-01-20 DIAGNOSIS — F17210 Nicotine dependence, cigarettes, uncomplicated: Secondary | ICD-10-CM | POA: Diagnosis not present

## 2017-01-20 DIAGNOSIS — R51 Headache: Secondary | ICD-10-CM | POA: Diagnosis not present

## 2017-01-20 DIAGNOSIS — J039 Acute tonsillitis, unspecified: Secondary | ICD-10-CM | POA: Diagnosis not present

## 2017-01-20 DIAGNOSIS — R6883 Chills (without fever): Secondary | ICD-10-CM | POA: Diagnosis not present

## 2017-01-20 DIAGNOSIS — J029 Acute pharyngitis, unspecified: Secondary | ICD-10-CM | POA: Diagnosis present

## 2017-01-20 MED ORDER — AMOXICILLIN 500 MG PO CAPS: 500.0000 mg | ORAL_CAPSULE | Freq: Three times a day (TID) | ORAL | 0 refills | Status: DC

## 2017-01-20 MED ORDER — LIDOCAINE VISCOUS 2 % MT SOLN
15.0000 mL | Freq: Once | OROMUCOSAL | Status: AC
Start: 2017-01-20 — End: 2017-01-20
  Administered 2017-01-20: 15 mL via OROMUCOSAL
  Filled 2017-01-20: qty 15

## 2017-01-20 MED ORDER — DEXAMETHASONE SODIUM PHOSPHATE 10 MG/ML IJ SOLN
10.0000 mg | Freq: Once | INTRAMUSCULAR | Status: AC
  Administered 2017-01-20: 10 mg via INTRAMUSCULAR
  Filled 2017-01-20: qty 1

## 2017-01-20 MED ORDER — ACETAMINOPHEN 325 MG PO TABS
650.0000 mg | ORAL_TABLET | Freq: Once | ORAL | Status: AC
  Administered 2017-01-20: 650 mg via ORAL
  Filled 2017-01-20: qty 2

## 2017-01-20 MED ORDER — DEXAMETHASONE 1.5 MG (21) PO TBPK: 1.5000 mg | ORAL_TABLET | Freq: Every day | ORAL | 0 refills | Status: DC
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
## 2017-01-20 NOTE — ED Notes (Signed)
Pt reports that she is having hot and cold flashes - she is c/o headache and sore throat - denies N/V - c/o abd pain

## 2017-01-20 NOTE — ED Triage Notes (Signed)
Sore throat chills and headache that began today.

## 2017-01-20 NOTE — ED Notes (Signed)
Rapid Strep test is negative. Unable to scan with the machine, machine not functioning properly.

## 2017-05-31 ENCOUNTER — Emergency Department: Admission: EM | Admit: 2017-05-31 | Discharge: 2017-05-31 | Discharge status: Absent without leave | Payer: Self-pay

## 2017-06-16 ENCOUNTER — Other Ambulatory Visit: Payer: Self-pay | Admitting: Family Medicine

## 2017-06-16 DIAGNOSIS — Z3481 Encounter for supervision of other normal pregnancy, first trimester: Secondary | ICD-10-CM

## 2017-06-22 ENCOUNTER — Other Ambulatory Visit: Payer: Self-pay | Admitting: Family Medicine

## 2017-06-22 DIAGNOSIS — Z3687 Encounter for antenatal screening for uncertain dates: Secondary | ICD-10-CM

## 2017-06-22 DIAGNOSIS — Z3481 Encounter for supervision of other normal pregnancy, first trimester: Secondary | ICD-10-CM

## 2017-06-23 ENCOUNTER — Ambulatory Visit
Admission: RE | Admit: 2017-06-23 | Discharge: 2017-06-23 | Disposition: A | Discharge status: Home / Self Care | Payer: Self-pay | Source: Ambulatory Visit | Attending: Family Medicine | Admitting: Family Medicine

## 2017-06-30 ENCOUNTER — Ambulatory Visit: Payer: Self-pay

## 2017-07-07 LAB — OB RESULTS CONSOLE RUBELLA ANTIBODY, IGM: RUBELLA: IMMUNE

## 2017-07-07 LAB — OB RESULTS CONSOLE HEPATITIS B SURFACE ANTIGEN: HEP B S AG: NEGATIVE

## 2017-07-07 LAB — OB RESULTS CONSOLE RPR: RPR: NONREACTIVE

## 2017-07-07 LAB — OB RESULTS CONSOLE VARICELLA ZOSTER ANTIBODY, IGG: VARICELLA IGG: IMMUNE

## 2017-07-09 ENCOUNTER — Ambulatory Visit: Payer: No Typology Code available for payment source

## 2017-07-09 ENCOUNTER — Ambulatory Visit
Admission: RE | Admit: 2017-07-09 | Discharge: 2017-07-09 | Disposition: A | Discharge status: Home / Self Care | Payer: Medicaid Other | Source: Ambulatory Visit | Attending: Family Medicine | Admitting: Family Medicine

## 2017-07-09 ENCOUNTER — Other Ambulatory Visit: Payer: Self-pay | Admitting: Family Medicine

## 2017-07-09 DIAGNOSIS — Z3A24 24 weeks gestation of pregnancy: Secondary | ICD-10-CM | POA: Diagnosis not present

## 2017-07-09 DIAGNOSIS — Z3481 Encounter for supervision of other normal pregnancy, first trimester: Secondary | ICD-10-CM

## 2017-07-09 DIAGNOSIS — Z3490 Encounter for supervision of normal pregnancy, unspecified, unspecified trimester: Secondary | ICD-10-CM | POA: Insufficient documentation

## 2017-07-09 DIAGNOSIS — Z3687 Encounter for antenatal screening for uncertain dates: Secondary | ICD-10-CM

## 2017-07-23 ENCOUNTER — Other Ambulatory Visit: Payer: Self-pay | Admitting: Family Medicine

## 2017-07-23 DIAGNOSIS — Z3482 Encounter for supervision of other normal pregnancy, second trimester: Secondary | ICD-10-CM

## 2017-07-23 DIAGNOSIS — Z3490 Encounter for supervision of normal pregnancy, unspecified, unspecified trimester: Secondary | ICD-10-CM

## 2017-07-28 ENCOUNTER — Ambulatory Visit: Admission: RE | Admit: 2017-07-28 | Payer: Self-pay | Source: Ambulatory Visit

## 2017-08-03 NOTE — L&D Delivery Note (Addendum)
Date of delivery: 10/24/2017  Estimated Date of Delivery: 10/28/17 Patient's last menstrual period was 01/21/2017. EGA: 5753w3d   Pt presented to L&D with active labor and SROM with meconium stained fluid at time of admission. Epidural placed for pain management. Pt progressed to complete, labored down x 30min, then pushed well x 10min.  Spontaneous delivery of fetal head in LOA position with restitution to LOT, Anterior then posterior shoulders delivered easily with gentle downward traction.  Baby placed on mom's chest, and attended to by peds.  Cord was doubly clamped and cut when pulseless.  Placenta spontaneously delivered, intact.  IV pitocin given for hemorrhage prophylaxis. Perineum, vagina and cervix were inspected and noted to be intact. Fundus was noted to be firm with small amt lochia. Mother and infant remain in birthing suite in stable condition.

## 2017-09-24 ENCOUNTER — Telehealth: Payer: Self-pay

## 2017-09-24 ENCOUNTER — Inpatient Hospital Stay
Admission: EM | Admit: 2017-09-24 | Discharge: 2017-09-24 | Disposition: A | Discharge status: Home / Self Care | Payer: Medicaid Other | Attending: Obstetrics & Gynecology | Admitting: Obstetrics & Gynecology

## 2017-09-24 ENCOUNTER — Other Ambulatory Visit: Payer: Self-pay

## 2017-09-24 DIAGNOSIS — O99343 Other mental disorders complicating pregnancy, third trimester: Secondary | ICD-10-CM | POA: Diagnosis not present

## 2017-09-24 DIAGNOSIS — F331 Major depressive disorder, recurrent, moderate: Secondary | ICD-10-CM | POA: Insufficient documentation

## 2017-09-24 DIAGNOSIS — A499 Bacterial infection, unspecified: Secondary | ICD-10-CM | POA: Diagnosis not present

## 2017-09-24 DIAGNOSIS — Z3A35 35 weeks gestation of pregnancy: Secondary | ICD-10-CM | POA: Diagnosis not present

## 2017-09-24 DIAGNOSIS — B9689 Other specified bacterial agents as the cause of diseases classified elsewhere: Secondary | ICD-10-CM | POA: Diagnosis not present

## 2017-09-24 DIAGNOSIS — R102 Pelvic and perineal pain: Secondary | ICD-10-CM | POA: Insufficient documentation

## 2017-09-24 DIAGNOSIS — O133 Gestational [pregnancy-induced] hypertension without significant proteinuria, third trimester: Secondary | ICD-10-CM | POA: Diagnosis not present

## 2017-09-24 DIAGNOSIS — O26893 Other specified pregnancy related conditions, third trimester: Secondary | ICD-10-CM | POA: Insufficient documentation

## 2017-09-24 DIAGNOSIS — O23593 Infection of other part of genital tract in pregnancy, third trimester: Secondary | ICD-10-CM | POA: Diagnosis not present

## 2017-09-24 DIAGNOSIS — O99323 Drug use complicating pregnancy, third trimester: Secondary | ICD-10-CM | POA: Insufficient documentation

## 2017-09-24 DIAGNOSIS — Z3490 Encounter for supervision of normal pregnancy, unspecified, unspecified trimester: Secondary | ICD-10-CM

## 2017-09-24 DIAGNOSIS — O4700 False labor before 37 completed weeks of gestation, unspecified trimester: Secondary | ICD-10-CM

## 2017-09-24 DIAGNOSIS — F17210 Nicotine dependence, cigarettes, uncomplicated: Secondary | ICD-10-CM | POA: Insufficient documentation

## 2017-09-24 DIAGNOSIS — Z79899 Other long term (current) drug therapy: Secondary | ICD-10-CM | POA: Diagnosis not present

## 2017-09-24 DIAGNOSIS — R45851 Suicidal ideations: Secondary | ICD-10-CM | POA: Diagnosis present

## 2017-09-24 DIAGNOSIS — O99333 Smoking (tobacco) complicating pregnancy, third trimester: Secondary | ICD-10-CM | POA: Insufficient documentation

## 2017-09-24 DIAGNOSIS — F1210 Cannabis abuse, uncomplicated: Secondary | ICD-10-CM | POA: Insufficient documentation

## 2017-09-24 DIAGNOSIS — R109 Unspecified abdominal pain: Secondary | ICD-10-CM | POA: Diagnosis present

## 2017-09-24 HISTORY — DX: Gestational (pregnancy-induced) hypertension without significant proteinuria, unspecified trimester: O13.9

## 2017-09-24 HISTORY — DX: Major depressive disorder, recurrent, moderate: F33.1

## 2017-09-24 LAB — URINALYSIS, COMPLETE (UACMP) WITH MICROSCOPIC
BILIRUBIN URINE: NEGATIVE
Glucose, UA: NEGATIVE mg/dL
Hgb urine dipstick: NEGATIVE
KETONES UR: NEGATIVE mg/dL
Nitrite: NEGATIVE
PROTEIN: NEGATIVE mg/dL
Specific Gravity, Urine: 1.008 (ref 1.005–1.030)
pH: 6 (ref 5.0–8.0)

## 2017-09-24 LAB — URINE DRUG SCREEN, QUALITATIVE (ARMC ONLY)
AMPHETAMINES, UR SCREEN: NOT DETECTED
BENZODIAZEPINE, UR SCRN: NOT DETECTED
Barbiturates, Ur Screen: NOT DETECTED
COCAINE METABOLITE, UR ~~LOC~~: NOT DETECTED
Cannabinoid 50 Ng, Ur ~~LOC~~: POSITIVE — AB
MDMA (ECSTASY) UR SCREEN: NOT DETECTED
METHADONE SCREEN, URINE: NOT DETECTED
OPIATE, UR SCREEN: NOT DETECTED
PHENCYCLIDINE (PCP) UR S: NOT DETECTED
Tricyclic, Ur Screen: NOT DETECTED

## 2017-09-24 MED ORDER — SERTRALINE HCL 50 MG PO TABS: 50.0000 mg | ORAL_TABLET | Freq: Every day | ORAL | 1 refills | Status: DC

## 2017-09-24 NOTE — Progress Notes (Signed)
Pt left the unit without receiving discharge instructions even though she was informed to wait until discharge instructions were reviewed and all questions were answered.
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
## 2017-09-24 NOTE — Discharge Summary (Signed)
Obstetric Discharge Summary Reason for Admission: pelvic pressure  Prenatal Procedures: NST, labor ruled out Intrapartum Procedures: N/A Postpartum Procedures: N/A Complications-Operative and Postpartum: None Hemoglobin  Date Value Ref Range Status  07/02/2016 8.8 (L) 12.0 - 16.0 g/dL Final   HGB  Date Value Ref Range Status  09/04/2014 12.1 12.0 - 16.0 g/dL Final   HCT  Date Value Ref Range Status  07/02/2016 27.9 (L) 35.0 - 47.0 % Final  09/04/2014 36.6 35.0 - 47.0 % Final    Physical Exam:  General: A,A&O x 3 Heart:S1S2, RRR, No M/R/G Lungs: CTA bilat, no W/R/R.  Uterine Fundus: Gravid  DVT Evaluation: neg Homan's Reflexes:2+/0  Discharge Diagnoses: IUP at 35 1/7 weeks with pelvic pressure  Discharge Information: Date: 09/24/2017 Activity: Pelvic rest Diet: Regular Medications: PNV Condition: Stable  Instructions: Return if pt starts into labor: ROM, VB, Uterine contractions every 5 mins, decreased Fetal movement.  Discharge to: Home   Pt has missed Prenatal care at intervals, There is no anatomy scan on file nor 1 h GCT. Will send pt [facility] to contact pt for f/u care. Pt needs to be seen ASAP and caught up on care including anatomy scan US, labs, etc.  Newborn Data: This patient has no babies on file.   Sharee PimpleCaron W Jones 09/24/2017, 3:19 PM
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
## 2017-09-24 NOTE — Discharge Instructions (Signed)
Major Depressive Disorder, Adult Major depressive disorder (MDD) is a mental health condition. It may also be called clinical depression or unipolar depression. MDD usually causes feelings of sadness, hopelessness, or helplessness. MDD can also cause physical symptoms. It can interfere with work, school, relationships, and other everyday activities. MDD may be mild, moderate, or severe. It may occur once (single episode major depressive disorder) or it may occur multiple times (recurrent major depressive disorder). What are the causes? The exact cause of this condition is not known. MDD is most likely caused by a combination of things, which may include:  Genetic factors. These are traits that are passed along from parent to child.  Individual factors. Your personality, your behavior, and the way you handle your thoughts and feelings may contribute to MDD. This includes personality traits and behaviors learned from others.  Physical factors, such as: ? Differences in the part of your brain that controls emotion. This part of your brain may be different than it is in people who do not have MDD. ? Long-term (chronic) medical or psychiatric illnesses.  Social factors. Traumatic experiences or major life changes may play a role in the development of MDD.  What increases the risk? This condition is more likely to develop in women. The following factors may also make you more likely to develop MDD:  A family history of depression.  Troubled family relationships.  Abnormally low levels of certain brain chemicals.  Traumatic events in childhood, especially abuse or the loss of a parent.  Being under a lot of stress, or long-term stress, especially from upsetting life experiences or losses.  A history of: ? Chronic physical illness. ? Other mental health disorders. ? Substance abuse.  Poor living conditions.  Experiencing social exclusion or discrimination on a regular basis.  What are  the signs or symptoms? The main symptoms of MDD typically include:  Constant depressed or irritable mood.  Loss of interest in things and activities.  MDD symptoms may also include:  Sleeping or eating too much or too little.  Unexplained weight change.  Fatigue or low energy.  Feelings of worthlessness or guilt.  Difficulty thinking clearly or making decisions.  Thoughts of suicide or of harming others.  Physical agitation or weakness.  Isolation.  Severe cases of MDD may also occur with other symptoms, such as:  Delusions or hallucinations, in which you imagine things that are not real (psychotic depression).  Low-level depression that lasts at least a year (chronic depression or persistent depressive disorder).  Extreme sadness and hopelessness (melancholic depression).  Trouble speaking and moving (catatonic depression).  How is this diagnosed? This condition may be diagnosed based on:  Your symptoms.  Your medical history, including your mental health history. This may involve tests to evaluate your mental health. You may be asked questions about your lifestyle, including any drug and alcohol use, and how long you have had symptoms of MDD.  A physical exam.  Blood tests to rule out other conditions.  You must have a depressed mood and at least four other MDD symptoms most of the day, nearly every day in the same 2-week timeframe before your health care provider can confirm a diagnosis of MDD. How is this treated? This condition is usually treated by mental health professionals, such as psychologists, psychiatrists, and clinical social workers. You may need more than one type of treatment. Treatment may include:  Psychotherapy. This is also called talk therapy or counseling. Types of psychotherapy include: ? Cognitive behavioral  therapy (CBT). This type of therapy teaches you to recognize unhealthy feelings, thoughts, and behaviors, and replace them with  positive thoughts and actions. ? Interpersonal therapy (IPT). This helps you to improve the way you relate to and communicate with others. ? Family therapy. This treatment includes members of your family.  Medicine to treat anxiety and depression, or to help you control certain emotions and behaviors.  Lifestyle changes, such as: ? Limiting alcohol and drug use. ? Exercising regularly. ? Getting plenty of sleep. ? Making healthy eating choices. ? Spending more time outdoors.  Treatments involving stimulation of the brain can be used in situations with extremely severe symptoms, or when medicine or other therapies do not work over time. These treatments include electroconvulsive therapy, transcranial magnetic stimulation, and vagal nerve stimulation. Follow these instructions at home: Activity  Return to your normal activities as told by your health care provider.  Exercise regularly and spend time outdoors as told by your health care provider. General instructions  Take over-the-counter and prescription medicines only as told by your health care provider.  Do not drink alcohol. If you drink alcohol, limit your alcohol intake to no more than 1 drink a day for nonpregnant women and 2 drinks a day for men. One drink equals 12 oz of beer, 5 oz of wine, or 1 oz of hard liquor. Alcohol can affect any antidepressant medicines you are taking. Talk to your health care provider about your alcohol use.  Eat a healthy diet and get plenty of sleep.  Find activities that you enjoy doing, and make time to do them.  Consider joining a support group. Your health care provider may be able to recommend a support group.  Keep all follow-up visits as told by your health care provider. This is important. Where to find more information: [employer] on Mental Illness  www.nami.org  [employer] of Mental Health  http://www.maynard.net/  National Suicide Prevention  Lifeline  1-800-273-TALK 203-324-2566). This is free, 24-hour help.  Contact a health care provider if:  Your symptoms get worse.  You develop new symptoms. Get help right away if:  You self-harm.  You have serious thoughts about hurting yourself or others.  You see, hear, taste, smell, or feel things that are not present (hallucinate). This information is not intended to replace advice given to you by your health care provider. Make sure you discuss any questions you have with your health care provider. Document Released: 11/14/2012 Document Revised: 03/26/2016 Document Reviewed: 01/29/2016 Elsevier Interactive Patient Education  2018 [employer].    Suicidal Feelings: How to Help Yourself Suicide is the taking of one's own life. If you feel as though life is getting too tough to handle and are thinking about suicide, get help right away. To get help:  Call your local emergency services (911 in the [place]  Call a suicide hotline to speak with a trained counselor who understands how you are feeling. The following is a list of suicide hotlines in the [place]. For a list of hotlines in [place], visit InkDistributor.it. ? 1-800-273-TALK 231-154-2801). ? 1-800-SUICIDE 365 472 9052). ? 6823291000. This is a hotline for Spanish speakers. ? 1-800-799-4TTY 640-143-0271). This is a hotline for TTY users. ? 1-866-4-U-TREVOR (985)408-8442). This is a hotline for lesbian, gay, bisexual, transgender, or questioning youth.  Contact a crisis center or a local suicide prevention center. To find a crisis center or suicide prevention center: ? Call your local hospital, clinic, community service organization, mental health center, social  service provider, or health department. Ask for assistance in connecting to a crisis center. ? Visit https://www.patel-king.com/ for a list of crisis centers in the [place], or visit www.suicideprevention.ca/thinking-about-suicide/find-a-crisis-centre for a list of centers in [place].  Visit the following websites: ? National Suicide Prevention Lifeline: www.suicidepreventionlifeline.org ? Hopeline: www.hopeline.com ? [employer] for Suicide Prevention: https://www.ayers.com/ ? [employer] (for lesbian, gay, bisexual, transgender, or questioning youth): www.thetrevorproject.org  How can I help myself feel better?  Promise yourself that you will not do anything drastic when you have suicidal feelings. Remember, there is hope. Many people have gotten through suicidal thoughts and feelings, and you will, too. You may have gotten through them before, and this proves that you can get through them again.  Let family, friends, teachers, or counselors know how you are feeling. Try not to isolate yourself from those who care about you. Remember, they will want to help you. Talk with someone every day, even if you do not feel sociable. Face-to-face conversation is best.  Call a mental health professional and see one regularly.  Visit your primary health care provider every year.  Eat a well-balanced diet, and space your meals so you eat regularly.  Get plenty of rest.  Avoid alcohol and drugs, and remove them from your home. They will only make you feel worse.  If you are thinking of taking a lot of medicine, give your medicine to someone who can give it to you one day at a time. If you are on antidepressants and are concerned you will overdose, let your health care provider know so he or she can give you safer medicines. Ask your mental health professional about the possible side effects of any medicines you are taking.  Remove weapons, poisons, knives, and anything else that could harm you from your home.  Try to stick to routines. Follow a schedule every day. Put self-care on your schedule.  Make a list of realistic goals, and cross them off when you  achieve them. Accomplishments give a sense of worth.  Wait until you are feeling better before doing the things you find difficult or unpleasant.  Exercise if you are able. You will feel better if you exercise for even a half hour each day.  Go out in the sun or into nature. This will help you recover from depression faster. If you have a favorite place to walk, go there.  Do the things that have always given you pleasure. Play your favorite music, read a good book, paint a picture, play your favorite instrument, or do anything else that takes your mind off your depression if it is safe to do.  Keep your living space well lit.  When you are feeling well, write yourself a letter about tips and support that you can read when you are not feeling well.  Remember that lifes difficulties can be sorted out with help. Conditions can be treated. You can work on thoughts and strategies that serve you well. This information is not intended to replace advice given to you by your health care provider. Make sure you discuss any questions you have with your health care provider. Document Released: 01/24/2003 Document Revised: 03/18/2016 Document Reviewed: 11/14/2013 Elsevier Interactive Patient Education  2018 Elsevier Inc. Pelvic Rest Pelvic rest may be recommended if:  Your placenta is partially or completely covering the opening of your cervix (placenta previa).  There is bleeding between the wall of the uterus and the amniotic sac in the first  trimester of pregnancy (subchorionic hemorrhage).  You went into labor too early (preterm labor).  Based on your overall health and the health of your baby, your health care provider will decide if pelvic rest is right for you. How do I rest my pelvis? For as long as told by your health care provider:  Do not have sex, sexual stimulation, or an orgasm.  Do not use tampons. Do not douche. Do not put anything in your vagina.  Do not lift anything that  is heavier than 10 lb (4.5 kg).  Avoid activities that take a lot of effort (are strenuous).  Avoid any activity in which your pelvic muscles could become strained.  When should I seek medical care? Seek medical care if you have:  Cramping pain in your lower abdomen.  Vaginal discharge.  A low, dull backache.  Regular contractions.  Uterine tightening.  When should I seek immediate medical care? Seek immediate medical care if:  You have vaginal bleeding and you are pregnant.  This information is not intended to replace advice given to you by your health care provider. Make sure you discuss any questions you have with your health care provider. Document Released: 11/14/2010 Document Revised: 12/26/2015 Document Reviewed: 01/21/2015 Elsevier Interactive Patient Education  2018 [employer]. Preventing Preterm Birth Preterm birth is when your baby is delivered between 20 weeks and 37 weeks of pregnancy. A full-term pregnancy lasts for at least 37 weeks. Preterm birth can be dangerous for your baby because the last few weeks of pregnancy are an important time for your baby's brain and lungs to grow. Many things can cause a baby to be born early. Sometimes the cause is not known. There are certain factors that make you more likely to experience preterm birth, such as:  Having a previous baby born preterm.  Being pregnant with twins or other multiples.  Having had fertility treatment.  Being overweight or underweight at the start of your pregnancy.  Having any of the following during pregnancy: ? An infection, including a urinary tract infection (UTI) or an STI (sexually transmitted infection). ? High blood pressure. ? Diabetes. ? Vaginal bleeding.  Being age 80 or older.  Being age 19 or younger.  Getting pregnant within 6 months of a previous pregnancy.  Suffering extreme stress or physical or emotional abuse during pregnancy.  Standing for long periods of time during  pregnancy, such as working at a job that requires standing.  What are the risks? The most serious risk of preterm birth is that the baby may not survive. This is more likely to happen if a baby is born before 34 weeks. Other risks and complications of preterm birth may include your baby having:  Breathing problems.  Brain damage that affects movement and coordination (cerebral palsy).  Feeding difficulties.  Vision or hearing problems.  Infections or inflammation of the digestive tract (colitis).  Developmental delays.  Learning disabilities.  Higher risk for diabetes, heart disease, and high blood pressure later in life.  What can I do to lower my risk? Medical care  The most important thing you can do to lower your risk for preterm birth is to get routine medical care during pregnancy (prenatal care). If you have a high risk of preterm birth, you may be referred to a health care provider who specializes in managing high-risk pregnancies (perinatologist). You may be given medicine to help prevent preterm birth. Lifestyle changes Certain lifestyle changes can also lower your risk of preterm birth:  Wait at least 6 months after a pregnancy to become pregnant again.  Try to plan pregnancy for when you are between 4119 and 22 years old.  Get to a healthy weight before getting pregnant. If you are overweight, work with your health care provider to safely lose weight.  Do not use any products that contain nicotine or tobacco, such as cigarettes and e-cigarettes. If you need help quitting, ask your health care provider.  Do not drink alcohol.  Do not use drugs.  Where to find support: For more support, consider:  Talking with your health care provider.  Talking with a therapist or substance abuse counselor, if you need help quitting.  Working with a diet and nutrition specialist (dietitian) or a Systems analystpersonal trainer to maintain a healthy weight.  Joining a support group.  Where  to find more information: Learn more about preventing preterm birth from:  Centers for Disease Control and Prevention: http://curry.org/cdc.gov/reproductivehealth/maternalinfanthealth/pretermbirth.htm  March of Dimes: marchofdimes.org/complications/premature-babies.aspx  American Pregnancy Association: americanpregnancy.org/labor-and-birth/premature-labor  Contact a health care provider if:  You have any of the following signs of preterm labor before 37 weeks: ? A change or increase in vaginal discharge. ? Fluid leaking from your vagina. ? Pressure or cramps in your lower abdomen. ? A backache that does not go away or gets worse. ? Regular tightening (contractions) in your lower abdomen. Summary  Preterm birth means having your baby during weeks 20-37 of pregnancy.  Preterm birth may put your baby at risk for physical and mental problems.  Getting good prenatal care can help prevent preterm birth.  You can lower your risk of preterm birth by making certain lifestyle changes, such as not smoking and not using alcohol. This information is not intended to replace advice given to you by your health care provider. Make sure you discuss any questions you have with your health care provider. Document Released: 09/03/2015 Document Revised: 03/28/2016 Document Reviewed: 03/28/2016 Elsevier Interactive Patient Education  [employer]2018 Elsevier Inc.

## 2017-09-24 NOTE — OB Triage Note (Addendum)
Pt states she has been experienncing contractions since last night and  is "unsure of how long the contractions were lasting." Pt also states she has been experiencing sharp pelvic pain since the beginning of February. Pt states she engaged in sexual intercourse two days ago. Pt states positive fetal movement and denies vaginal bleeding and LOF. Pt states she feels weak since being seen in triage.

## 2017-09-24 NOTE — Progress Notes (Signed)
Called patient phone number on file per CNM, Beatriz Stallion. Jones request to give pt discharge instructions by phone. No answer.

## 2017-09-26 LAB — URINE CULTURE

## 2017-09-27 DIAGNOSIS — F418 Other specified anxiety disorders: Secondary | ICD-10-CM | POA: Insufficient documentation

## 2017-09-29 LAB — OB RESULTS CONSOLE GC/CHLAMYDIA
Chlamydia: NEGATIVE
Gonorrhea: NEGATIVE

## 2017-09-29 LAB — OB RESULTS CONSOLE GBS: GBS: NEGATIVE

## 2017-10-01 ENCOUNTER — Other Ambulatory Visit: Payer: Self-pay | Admitting: Family Medicine

## 2017-10-01 DIAGNOSIS — Z3493 Encounter for supervision of normal pregnancy, unspecified, third trimester: Secondary | ICD-10-CM

## 2017-10-05 LAB — OB RESULTS CONSOLE HIV ANTIBODY (ROUTINE TESTING): HIV: NONREACTIVE

## 2017-10-08 ENCOUNTER — Ambulatory Visit: Admission: RE | Admit: 2017-10-08 | Payer: Medicaid Other | Source: Ambulatory Visit

## 2017-10-13 ENCOUNTER — Observation Stay
Admission: EM | Admit: 2017-10-13 | Discharge: 2017-10-13 | Disposition: A | Discharge status: Home / Self Care | Payer: Medicaid Other | Attending: Certified Nurse Midwife | Admitting: Certified Nurse Midwife

## 2017-10-13 ENCOUNTER — Other Ambulatory Visit: Payer: Self-pay

## 2017-10-13 DIAGNOSIS — R1030 Lower abdominal pain, unspecified: Secondary | ICD-10-CM | POA: Diagnosis not present

## 2017-10-13 DIAGNOSIS — O26893 Other specified pregnancy related conditions, third trimester: Secondary | ICD-10-CM | POA: Diagnosis present

## 2017-10-13 DIAGNOSIS — O2693 Pregnancy related conditions, unspecified, third trimester: Secondary | ICD-10-CM | POA: Diagnosis not present

## 2017-10-13 DIAGNOSIS — R109 Unspecified abdominal pain: Secondary | ICD-10-CM | POA: Diagnosis present

## 2017-10-13 DIAGNOSIS — Z3A37 37 weeks gestation of pregnancy: Secondary | ICD-10-CM | POA: Insufficient documentation

## 2017-10-13 DIAGNOSIS — M545 Low back pain: Secondary | ICD-10-CM | POA: Insufficient documentation

## 2017-10-13 LAB — URINALYSIS, COMPLETE (UACMP) WITH MICROSCOPIC
Bilirubin Urine: NEGATIVE
Glucose, UA: NEGATIVE mg/dL
Hgb urine dipstick: NEGATIVE
Ketones, ur: NEGATIVE mg/dL
NITRITE: NEGATIVE
PH: 7 (ref 5.0–8.0)
Protein, ur: NEGATIVE mg/dL
SPECIFIC GRAVITY, URINE: 1.002 — AB (ref 1.005–1.030)

## 2017-10-13 MED ORDER — ACETAMINOPHEN 325 MG PO TABS: 650.0000 mg | ORAL_TABLET | ORAL | Status: DC | PRN

## 2017-10-13 NOTE — OB Triage Provider Note (Signed)
Audrey Beard is a 22 y.o. female. She is at 5764w6d gestation. Patient's last menstrual period was 01/21/2017. Estimated Date of Delivery: 10/28/17  [facility]   Chief complaint: abdominal pain Location: b/l low abdomen Onset/timing: a couple of days ago Duration: intermittent Quality: stabbing Severity: moderate to severe Aggravating or alleviating conditions: none Associated signs/symptoms: low back pain, no flank pain, no dysuria Context: Audrey Beard reports a lower abdominal pain that started a couple of days ago and comes and goes. She rates it 8/10 pain at its worst. She drinks lots of fluid, but mainly pop. She cannot recall anything that makes the pain better or worse. She has not tried any OTC analgesics for the pain.   S:  Resting comfortably. She reports blood tinged discharge a couple of days ago, no vaginal bleeding since; no leakage of fluid, no contractions, and active fetal movement.   Maternal Medical History:   Pregnancy issues: -hx depression, recently started on Zoloft -anemia -current smoker -hx preeclampsia in her first pregnancy -total 3 prenatal care visits  Past Medical History:  Diagnosis Date  . Depression   . Pregnancy induced hypertension     Past Surgical History:  Procedure Laterality Date  . NO PAST SURGERIES      No Known Allergies  Prior to Admission medications   Medication Sig Start Date End Date Taking? Authorizing Provider  Prenatal Vit-Fe Fumarate-FA (MULTIVITAMIN-PRENATAL) 27-0.8 MG TABS tablet Take 1 tablet by mouth daily at 12 noon.   Yes [provider]  sertraline (ZOLOFT) 50 MG tablet Take 1 tablet (50 mg total) by mouth daily. 09/24/17  Yes Clapacs, Jackquline DenmarkJohn T, MD  valACYclovir (VALTREX) 1000 MG tablet Take 1,000 mg by mouth daily.   Yes [provider]  amoxicillin (AMOXIL) 500 MG capsule Take 1 capsule (500 mg total) by mouth 3 (three) times daily. Patient not taking: Reported on 09/24/2017  01/20/17   Enid DerryWagner, Ashley, PA-C  cephALEXin (KEFLEX) 500 MG capsule Take 1 capsule (500 mg total) by mouth 4 (four) times daily. Patient not taking: Reported on 07/01/2016 06/22/16   Ward, Elenora Fenderhelsea C, MD  Dexamethasone (DEXPAK 6 DAY) 1.5 MG (21) TBPK Take 1.5 mg by mouth daily. Take 6 pills on day one, take 5 on day 2, take 4 on day 3, take 3 on day 4, take 2 on day 5, take one on day 6 Patient not taking: Reported on 09/24/2017 01/20/17   Enid DerryWagner, Ashley, PA-C     Social History: She  reports that she has been smoking cigarettes.  She has a 1.00 pack-year smoking history. she has never used smokeless tobacco. She reports that she uses drugs. Drug: Marijuana. She reports that she does not drink alcohol.  Family History: family history is not on file.   Review of Systems: A full review of systems was performed and negative except as noted in the HPI.    O:  BP 124/82 (BP Location: Left Arm)   Pulse (!) 125   Temp 98.7 F (37.1 C) (Oral)   Resp 19   Ht 5\' 3"  (1.6 m)   Wt 67.6 kg (149 lb)   LMP 01/21/2017   BMI 26.39 kg/m  No results found for this or any previous visit (from the past 48 hour(s)).   Constitutional: NAD, AAOx3  HE/ENT: extraocular movements grossly intact, moist mucous membranes CV: RRR PULM: nl respiratory effort, CTABL     Abd: gravid, mild tenderness r lateral abdomen, non-distended, soft  Ext: Non-tender, Nonedmeatous   Psych: flat affect, speech normal Pelvic: FT/50%/-3, medium/posterior  NST:  Baseline: 150bpm Variability: moderate Accelerations: 15x15 present x >2 Decelerations: absent Time:   A/P: 22 y.o. [redacted]w[redacted]d here for antenatal surveillance for abdominal pain  Labor: not present.   Fetal Wellbeing: reactive NST, reassuring  Advised Tylenol PRN, hydration with water, and pregnancy support belt for abdominal and lower back pain.    UA pending, will call patient with positive results.   D/c home stable, precautions reviewed, follow-up as  scheduled with [facility].   ----- Genia Del, CNM Certified Nurse Midwife [facility], Department of OB/[facility]N [facility]

## 2017-10-13 NOTE — OB Triage Note (Signed)
Patient came in for observation for contractions and lower back pain that she has been having for the last 2 days. Patient reports stabbing and aching and rates pain 8/10. Patient reports irregular uterine contractions for the last two days. Patient reports + FM.  Patient denies leaking of fluid, denies vaginal bleeding and spotting. Vital signs stable and patient afebrile. FHR baseline 145 with moderate variability with accelerations 15 x 15 and no decelerations. Family at bedside. Will continue to monitor.

## 2017-10-14 ENCOUNTER — Other Ambulatory Visit: Payer: Self-pay | Admitting: Certified Nurse Midwife

## 2017-10-14 DIAGNOSIS — O26899 Other specified pregnancy related conditions, unspecified trimester: Secondary | ICD-10-CM

## 2017-10-14 DIAGNOSIS — R109 Unspecified abdominal pain: Principal | ICD-10-CM

## 2017-10-19 ENCOUNTER — Other Ambulatory Visit: Payer: Self-pay | Admitting: Certified Nurse Midwife

## 2017-10-24 ENCOUNTER — Inpatient Hospital Stay: Payer: Medicaid Other | Admitting: Anesthesiology

## 2017-10-24 ENCOUNTER — Inpatient Hospital Stay
Admission: EM | Admit: 2017-10-24 | Discharge: 2017-10-26 | DRG: 806 | Disposition: A | Discharge status: Home / Self Care | Payer: Medicaid Other | Attending: Obstetrics and Gynecology | Admitting: Obstetrics and Gynecology

## 2017-10-24 DIAGNOSIS — D62 Acute posthemorrhagic anemia: Secondary | ICD-10-CM | POA: Diagnosis not present

## 2017-10-24 DIAGNOSIS — O99344 Other mental disorders complicating childbirth: Secondary | ICD-10-CM | POA: Diagnosis present

## 2017-10-24 DIAGNOSIS — O99334 Smoking (tobacco) complicating childbirth: Secondary | ICD-10-CM | POA: Diagnosis present

## 2017-10-24 DIAGNOSIS — F331 Major depressive disorder, recurrent, moderate: Secondary | ICD-10-CM | POA: Diagnosis present

## 2017-10-24 DIAGNOSIS — F419 Anxiety disorder, unspecified: Secondary | ICD-10-CM | POA: Diagnosis present

## 2017-10-24 DIAGNOSIS — Z3A39 39 weeks gestation of pregnancy: Secondary | ICD-10-CM

## 2017-10-24 DIAGNOSIS — F17210 Nicotine dependence, cigarettes, uncomplicated: Secondary | ICD-10-CM | POA: Diagnosis present

## 2017-10-24 DIAGNOSIS — O9081 Anemia of the puerperium: Secondary | ICD-10-CM | POA: Diagnosis not present

## 2017-10-24 DIAGNOSIS — Z3483 Encounter for supervision of other normal pregnancy, third trimester: Secondary | ICD-10-CM | POA: Diagnosis present

## 2017-10-24 LAB — CBC
HCT: 29.2 % — ABNORMAL LOW (ref 35.0–47.0)
HEMOGLOBIN: 8.8 g/dL — AB (ref 12.0–16.0)
MCH: 21 pg — AB (ref 26.0–34.0)
MCHC: 30 g/dL — ABNORMAL LOW (ref 32.0–36.0)
MCV: 70.2 fL — AB (ref 80.0–100.0)
Platelets: 260 10*3/uL (ref 150–440)
RBC: 4.16 MIL/uL (ref 3.80–5.20)
RDW: 21 % — AB (ref 11.5–14.5)
WBC: 11.4 10*3/uL — ABNORMAL HIGH (ref 3.6–11.0)

## 2017-10-24 LAB — COMPREHENSIVE METABOLIC PANEL
ALBUMIN: 2.9 g/dL — AB (ref 3.5–5.0)
ALK PHOS: 224 U/L — AB (ref 38–126)
ALT: 7 U/L — AB (ref 14–54)
AST: 22 U/L (ref 15–41)
Anion gap: 8 (ref 5–15)
BILIRUBIN TOTAL: 1.1 mg/dL (ref 0.3–1.2)
CO2: 21 mmol/L — ABNORMAL LOW (ref 22–32)
CREATININE: 0.48 mg/dL (ref 0.44–1.00)
Calcium: 8.9 mg/dL (ref 8.9–10.3)
Chloride: 110 mmol/L (ref 101–111)
GFR calc Af Amer: >60 mL/min (ref >60)
GLUCOSE: 95 mg/dL (ref 65–99)
POTASSIUM: 3.3 mmol/L — AB (ref 3.5–5.1)
Sodium: 139 mmol/L (ref 135–145)
TOTAL PROTEIN: 7.2 g/dL (ref 6.5–8.1)

## 2017-10-24 LAB — TYPE AND SCREEN
ABO/RH(D): AB POS
Antibody Screen: NEGATIVE

## 2017-10-24 LAB — CHLAMYDIA/NGC RT PCR (ARMC ONLY)
Chlamydia Tr: NOT DETECTED
N gonorrhoeae: NOT DETECTED

## 2017-10-24 MED ORDER — OXYTOCIN 10 UNIT/ML IJ SOLN
INTRAMUSCULAR | Status: AC
  Filled 2017-10-24: qty 2

## 2017-10-24 MED ORDER — SOD CITRATE-CITRIC ACID 500-334 MG/5ML PO SOLN: 30.0000 mL | ORAL | Status: DC | PRN

## 2017-10-24 MED ORDER — MISOPROSTOL 200 MCG PO TABS
ORAL_TABLET | ORAL | Status: AC
  Filled 2017-10-24: qty 4

## 2017-10-24 MED ORDER — PHENYLEPHRINE 40 MCG/ML (10ML) SYRINGE FOR IV PUSH (FOR BLOOD PRESSURE SUPPORT)
80.0000 ug | PREFILLED_SYRINGE | INTRAVENOUS | Status: DC | PRN
  Filled 2017-10-24: qty 5

## 2017-10-24 MED ORDER — ONDANSETRON HCL 4 MG/2ML IJ SOLN: 4.0000 mg | INTRAMUSCULAR | Status: DC | PRN

## 2017-10-24 MED ORDER — LIDOCAINE HCL (PF) 1 % IJ SOLN
INTRAMUSCULAR | Status: DC | PRN
  Administered 2017-10-24: 2 mL

## 2017-10-24 MED ORDER — SERTRALINE HCL 25 MG PO TABS
25.0000 mg | ORAL_TABLET | Freq: Every day | ORAL | Status: DC
  Administered 2017-10-25: 25 mg via ORAL
  Filled 2017-10-24: qty 1

## 2017-10-24 MED ORDER — EPHEDRINE 5 MG/ML INJ
10.0000 mg | INTRAVENOUS | Status: DC | PRN
  Filled 2017-10-24: qty 2

## 2017-10-24 MED ORDER — COCONUT OIL OIL
1.0000 | TOPICAL_OIL | Status: DC | PRN
Start: 2017-10-24 — End: 2017-10-26
  Administered 2017-10-24: 1 via TOPICAL
  Filled 2017-10-24: qty 120

## 2017-10-24 MED ORDER — FENTANYL 2.5 MCG/ML W/ROPIVACAINE 0.15% IN NS 100 ML EPIDURAL (ARMC)
EPIDURAL | Status: DC | PRN
  Administered 2017-10-24: 12 mL/h via EPIDURAL

## 2017-10-24 MED ORDER — AMMONIA AROMATIC IN INHA
RESPIRATORY_TRACT | Status: AC
  Filled 2017-10-24: qty 10

## 2017-10-24 MED ORDER — SENNOSIDES-DOCUSATE SODIUM 8.6-50 MG PO TABS
2.0000 | ORAL_TABLET | ORAL | Status: DC
  Administered 2017-10-24 – 2017-10-25 (×2): 2 via ORAL
  Filled 2017-10-24 (×2): qty 2

## 2017-10-24 MED ORDER — ONDANSETRON HCL 4 MG/2ML IJ SOLN: 4.0000 mg | Freq: Four times a day (QID) | INTRAMUSCULAR | Status: DC | PRN

## 2017-10-24 MED ORDER — ACETAMINOPHEN 325 MG PO TABS: 650.0000 mg | ORAL_TABLET | ORAL | Status: DC | PRN

## 2017-10-24 MED ORDER — LACTATED RINGERS IV SOLN: INTRAVENOUS | Status: DC

## 2017-10-24 MED ORDER — BENZOCAINE-MENTHOL 20-0.5 % EX AERO: 1.0000 "application " | INHALATION_SPRAY | CUTANEOUS | Status: DC | PRN

## 2017-10-24 MED ORDER — LIDOCAINE HCL (PF) 1 % IJ SOLN
INTRAMUSCULAR | Status: AC
  Filled 2017-10-24: qty 30

## 2017-10-24 MED ORDER — SIMETHICONE 80 MG PO CHEW: 80.0000 mg | CHEWABLE_TABLET | ORAL | Status: DC | PRN

## 2017-10-24 MED ORDER — DIPHENHYDRAMINE HCL 50 MG/ML IJ SOLN: 12.5000 mg | INTRAMUSCULAR | Status: DC | PRN

## 2017-10-24 MED ORDER — OXYTOCIN BOLUS FROM INFUSION
500.0000 mL | Freq: Once | INTRAVENOUS | Status: AC
  Administered 2017-10-24: 500 mL via INTRAVENOUS

## 2017-10-24 MED ORDER — DIPHENHYDRAMINE HCL 25 MG PO CAPS: 25.0000 mg | ORAL_CAPSULE | Freq: Four times a day (QID) | ORAL | Status: DC | PRN

## 2017-10-24 MED ORDER — SODIUM CHLORIDE 0.9 % IV SOLN
INTRAVENOUS | Status: DC | PRN
  Administered 2017-10-24 (×2): 5 mL via EPIDURAL

## 2017-10-24 MED ORDER — FENTANYL 2.5 MCG/ML W/ROPIVACAINE 0.15% IN NS 100 ML EPIDURAL (ARMC)
EPIDURAL | Status: AC
  Filled 2017-10-24: qty 100

## 2017-10-24 MED ORDER — LACTATED RINGERS IV SOLN
500.0000 mL | INTRAVENOUS | Status: DC | PRN
  Administered 2017-10-24: 1000 mL via INTRAVENOUS

## 2017-10-24 MED ORDER — FENTANYL 2.5 MCG/ML W/ROPIVACAINE 0.15% IN NS 100 ML EPIDURAL (ARMC): 12.0000 mL/h | EPIDURAL | Status: DC

## 2017-10-24 MED ORDER — LACTATED RINGERS IV SOLN: 500.0000 mL | Freq: Once | INTRAVENOUS | Status: DC

## 2017-10-24 MED ORDER — LIDOCAINE-EPINEPHRINE (PF) 1.5 %-1:200000 IJ SOLN
INTRAMUSCULAR | Status: DC | PRN
  Administered 2017-10-24: 3 mL via EPIDURAL

## 2017-10-24 MED ORDER — PRENATAL MULTIVITAMIN CH
1.0000 | ORAL_TABLET | Freq: Every day | ORAL | Status: DC
  Administered 2017-10-24 – 2017-10-25 (×2): 1 via ORAL
  Filled 2017-10-24 (×2): qty 1

## 2017-10-24 MED ORDER — IBUPROFEN 600 MG PO TABS
600.0000 mg | ORAL_TABLET | Freq: Four times a day (QID) | ORAL | Status: DC
  Administered 2017-10-24 – 2017-10-26 (×9): 600 mg via ORAL
  Filled 2017-10-24 (×10): qty 1

## 2017-10-24 MED ORDER — WITCH HAZEL-GLYCERIN EX PADS: 1.0000 "application " | MEDICATED_PAD | CUTANEOUS | Status: DC | PRN

## 2017-10-24 MED ORDER — DIBUCAINE 1 % RE OINT: 1.0000 "application " | TOPICAL_OINTMENT | RECTAL | Status: DC | PRN

## 2017-10-24 MED ORDER — ONDANSETRON HCL 4 MG PO TABS: 4.0000 mg | ORAL_TABLET | ORAL | Status: DC | PRN

## 2017-10-24 MED ORDER — OXYTOCIN 40 UNITS IN LACTATED RINGERS INFUSION - SIMPLE MED
2.5000 [IU]/h | INTRAVENOUS | Status: DC
  Filled 2017-10-24: qty 1000

## 2017-10-24 MED ORDER — LIDOCAINE HCL (PF) 1 % IJ SOLN: 30.0000 mL | INTRAMUSCULAR | Status: DC | PRN

## 2017-10-24 MED ORDER — FENTANYL CITRATE (PF) 100 MCG/2ML IJ SOLN: 50.0000 ug | INTRAMUSCULAR | Status: DC | PRN

## 2017-10-24 MED ORDER — ZOLPIDEM TARTRATE 5 MG PO TABS: 5.0000 mg | ORAL_TABLET | Freq: Every evening | ORAL | Status: DC | PRN

## 2017-10-24 NOTE — Lactation Note (Signed)
This note was copied from a baby's chart. Lactation Consultation Note  Patient Name: Audrey Beard ZOXWR'UToday's Date: 10/24/2017 Reason for consult: Follow-up assessment;Term;Other (Comment)(Mom was (+) MJ 09/24/2017 (Urine bag on baby)) Mom has been bottlefeeding formula, but reports Audrey Beard is not tolerating formula and wants to begin breast feeding.  Mom had (+) drug screen for MJ 09/24/2017. Explained that we may need to reassess when baby's urine screen comes back.  Discussed risks to baby and mom agrees to not take any harmful drugs while breast feeding.  Discussed supply and demand and need to breast feed frequently to bring in mature milk and ensure a plentiful milk supply.  Demonstrated hand expression and was able to get colostrum before putting Jarron on breast.  Explained normal course of lactation and routine newborn feeding patterns.  Lactation name and number written on white board and encouraged to call for questions, concerns or assistance with breast feeding.  Maternal Data Formula Feeding for Exclusion: No Has patient been taught Hand Expression?: Yes Does the patient have breastfeeding experience prior to this delivery?: No  Feeding Feeding Type: Breast Fed Length of feed: 20 min  LATCH Score                   Interventions    Lactation Tools Discussed/Used Tools: Coconut oil   Consult Status      Louis MeckelWilliams, Khris Jansson Kay 10/24/2017, 6:40 PM

## 2017-10-24 NOTE — Progress Notes (Signed)
Post Partum Day 0 Subjective: Doing well, no complaints.  Tolerating regular diet, pain with PO meds, voiding and ambulating without difficulty.  No CP SOB Fever,Chills, N/V or leg pain; denies nipple or breast pain no HA change of vision, RUQ/epigastric pain  Objective: BP (!) 141/97 Comment: notify Verlon AuLeslie, RN of bp  Pulse 85   Temp 97.7 F (36.5 C) (Oral)   Resp 16   Ht 5\' 3"  (1.6 m)   Wt 154 lb (69.9 kg)   LMP 01/21/2017   SpO2 100%   Breastfeeding? Unknown   BMI 27.28 kg/m    Physical Exam:  General: NAD Breasts: soft/nontender CV: RRR Pulm: nl effort, CTABL Abdomen: soft, NT, BS x 4 Perineum: minimal edema, intact/lacerations hemostatic/repair well approximated Lochia: moderate Uterine Fundus: fundus firm and 2 fb below umbilicus DVT Evaluation: no cords, ttp LEs; 2+ DTRs, no clonus  Recent Labs    10/24/17 0209  HGB 8.8*  HCT 29.2*  WBC 11.4*  PLT 260  CMP on admission- reviewed, LFTs and Creat WNL  Assessment/Plan: 22 y.o. [id number] postpartum day # 0  - Continue routine PP care - Elevated BPs 140s/90s, mild range. Will continue to monitor closely due to hx pre-e with 1st delivery.     Disposition: remain inpt.     McVey, REBECCA A, CNM 10/24/2017  10:02 AM

## 2017-10-24 NOTE — Anesthesia Procedure Notes (Signed)
Epidural Patient location during procedure: OB Start time: 10/24/2017 3:22 AM End time: 10/24/2017 3:28 AM  Staffing Anesthesiologist: Lenard SimmerKarenz, Janie Strothman, MD Performed: anesthesiologist   Preanesthetic Checklist Completed: patient identified, site marked, surgical consent, pre-op evaluation, timeout performed, IV checked, risks and benefits discussed and monitors and equipment checked  Epidural Patient position: sitting Prep: ChloraPrep Patient monitoring: heart rate, continuous pulse ox and blood pressure Approach: midline Location: L3-L4 Injection technique: LOR saline  Needle:  Needle type: Tuohy  Needle gauge: 17 G Needle length: 9 cm and 9 Needle insertion depth: 5.5 cm Catheter type: closed end flexible Catheter size: 19 Gauge Catheter at skin depth: 10 cm Test dose: negative and 1.5% lidocaine with Epi 1:200 K  Assessment Sensory level: T10 Events: blood not aspirated, injection not painful, no injection resistance, negative IV test and no paresthesia  Additional Notes Pt. Evaluated and documentation done after procedure finished. Patient identified. Risks/Benefits/Options discussed with patient including but not limited to bleeding, infection, nerve damage, paralysis, failed block, incomplete pain control, headache, blood pressure changes, nausea, vomiting, reactions to medication both or allergic, itching and postpartum back pain. Confirmed with bedside nurse the patient's most recent platelet count. Confirmed with patient that they are not currently taking any anticoagulation, have any bleeding history or any family history of bleeding disorders. Patient expressed understanding and wished to proceed. All questions were answered. Sterile technique was used throughout the entire procedure. Please see nursing notes for vital signs. Test dose was given through epidural catheter and negative prior to continuing to dose epidural or start infusion. Warning signs of high block given  to the patient including shortness of breath, tingling/numbness in hands, complete motor block, or any concerning symptoms with instructions to call for help. Patient was given instructions on fall risk and not to get out of bed. All questions and concerns addressed with instructions to call with any issues or inadequate analgesia.   Patient tolerated the insertion well without immediate complications.Reason for block:procedure for pain

## 2017-10-24 NOTE — Anesthesia Preprocedure Evaluation (Signed)
Anesthesia Evaluation  Patient identified by MRN, date of birth, ID band Patient awake    Reviewed: Allergy & Precautions, H&P , NPO status , Patient's Chart, lab work & pertinent test results, reviewed documented beta blocker date and time   History of Anesthesia Complications Negative for: history of anesthetic complications  Airway Mallampati: II  TM Distance: >3 FB Neck ROM: full    Dental  (+) Dental Advidsory Given, Teeth Intact   Pulmonary neg shortness of breath, neg COPD, neg recent URI, Current Smoker,           Cardiovascular Exercise Tolerance: Good negative cardio ROS       Neuro/Psych PSYCHIATRIC DISORDERS Depression negative neurological ROS     GI/Hepatic negative GI ROS, Neg liver ROS,   Endo/Other  negative endocrine ROS  Renal/GU negative Renal ROS  negative genitourinary   Musculoskeletal   Abdominal   Peds  Hematology negative hematology ROS (+)   Anesthesia Other Findings Past Medical History: No date: Depression No date: Pregnancy induced hypertension   Reproductive/Obstetrics negative OB ROS                             Anesthesia Physical Anesthesia Plan  ASA: II  Anesthesia Plan: Epidural   Post-op Pain Management:    Induction:   PONV Risk Score and Plan:   Airway Management Planned:   Additional Equipment:   Intra-op Plan:   Post-operative Plan:   Informed Consent: I have reviewed the patients History and Physical, chart, labs and discussed the procedure including the risks, benefits and alternatives for the proposed anesthesia with the patient or authorized representative who has indicated his/her understanding and acceptance.   Dental Advisory Given  Plan Discussed with: Anesthesiologist, CRNA and Surgeon  Anesthesia Plan Comments:         Anesthesia Quick Evaluation

## 2017-10-24 NOTE — H&P (Signed)
OB History & Physical   History of Present Illness:  Chief Complaint: painful UCs since 2000 HPI:  Audrey Beard is a 22 y.o. G66P2002 female at [redacted]w[redacted]d dated by 24+1wk [place].  She presents to L&D for painful UCs.   Estimated Date of Delivery: 10/28/17  Active FM onset of ctx @ 2000 currently every 2-3 minutes Denies LOF or SROM. Small amt bloody show and mucus.     Pregnancy Issues: 1. Limited PNC, 3 visits total starting at 24wks 2. Anemia, Hgb 8.3; non-compliant with FeSO4 3. 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 Previous hx Pre-e with G1  Maternal Medical History:   Past Medical History:  Diagnosis Date  . Depression   . Pregnancy induced hypertension     Past Surgical History:  Procedure Laterality Date  . NO PAST SURGERIES      No Known Allergies  Prior to Admission medications   Medication Sig Start Date End Date Taking? Authorizing Provider  Prenatal Vit-Fe Fumarate-FA (MULTIVITAMIN-PRENATAL) 27-0.8 MG TABS tablet Take 1 tablet by mouth daily at 12 noon.   Yes [provider]  sertraline (ZOLOFT) 50 MG tablet Take 1 tablet (50 mg total) by mouth daily. 09/24/17  Yes Clapacs, Jackquline Denmark, MD  acetaminophen (TYLENOL) 325 MG tablet Take 2 tablets (650 mg total) by mouth every 4 (four) hours as needed for mild pain or moderate pain. 10/13/17   Genia Del, CNM  valACYclovir (VALTREX) 1000 MG tablet Take 1,000 mg by mouth daily.    [provider]     [facility]  Social History: She  reports that she has been smoking cigarettes.  She has a 1.00 pack-year smoking history. She has never used smokeless tobacco. She reports that she has current or past drug history. Drug: Marijuana. She reports that she does not drink alcohol.  Family History: family history is not on file.   Review of Systems: A full review of systems was performed and negative except as noted in the HPI.     Physical Exam:  Vital Signs: BP 133/83   Pulse 93    Temp 98.4 F (36.9 C) (Oral)   Resp 18   Ht 5\' 3"  (1.6 m)   Wt 154 lb (69.9 kg)   LMP 01/21/2017   BMI 27.28 kg/m  General: no acute distress.  HEENT: normocephalic, atraumatic; eyes non-icteric Heart: regular rate & rhythm.  No murmurs/rubs/gallops Lungs: clear to auscultation bilaterally, normal respiratory effort Abdomen: soft, gravid, non-tender;  EFW: 7lbs Pelvic: SSE done, no e/o HSV lesions, pt denies prodrome, reports no outbreaks in last year.   External: Normal external female genitalia  Cervix: Dilation: 3 / Effacement (%): 70 / Station: -3 initial SVE per Charity fundraiser.    Extremities: non-tender, symmetric, No edema bilaterally.  DTRs: 2+  Neurologic: Alert & oriented x 3.    No results found for this or any previous visit (from the past 24 hour(s)).  Pertinent Results:  Prenatal Labs: Blood type/Rh AB Pos  Antibody screen neg  Rubella Immune  Varicella Immune  RPR NR  HBsAg Neg  HIV NR  GC neg  Chlamydia neg  Genetic screening Not done  1 hour GTT 115  GBS neg   FHT: Cat I tracing, 140bpm, mod variability, no decels, + accels.  TOCO: q2-12min SVE:  5/80/-2; intact BOW   Cephalic by leopolds  No results found.  Assessment:  Audrey Beard is a 22 y.o. G25P2002 female at [redacted]w[redacted]d with active  labor   Plan:  1. Admit to Labor & Delivery; consents reviewed and obtained  2. Fetal Well being  - Fetal Tracing: Cat I - Group B Streptococcus ppx indicated: neg - Presentation: cephalic confirmed by exam   3. Routine OB: - Prenatal labs reviewed, as above - Rh AB Pos - CBC, T&S, RPR on admit - Clear fluids, IVF  4. Monitoring of Labor -  Contractions: external toco in place -  Pelvis proven to 7lbs -  Plan for continuous fetal monitoring  -  Maternal pain control as desired; requesting regional anesthesia - Anticipate vaginal delivery  5. Post Partum Planning: - Infant feeding: pending - Contraception: pending  Naasir Carreira A, CNM 10/24/17 1:41  AM

## 2017-10-24 NOTE — OB Triage Note (Addendum)
Patient came in from home complaining of contractions that started at 2000 tonight. Rates contraction pain a 8 out of 10, lower abdomen. Denies vaginal bleeding. Denies LOF. Decrease FM throughout the day. Denies sexual intercourse in the last 24 hours. Current smoker this pregnancy. Initial BP of 133/89, temp 98.4. Will cycle BP every 15 mins. EFM placed and assessing.
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
## 2017-10-25 ENCOUNTER — Other Ambulatory Visit: Payer: Self-pay

## 2017-10-25 DIAGNOSIS — F331 Major depressive disorder, recurrent, moderate: Secondary | ICD-10-CM

## 2017-10-25 LAB — CBC
HCT: 24.4 % — ABNORMAL LOW (ref 35.0–47.0)
Hemoglobin: 7.4 g/dL — ABNORMAL LOW (ref 12.0–16.0)
MCH: 21.2 pg — ABNORMAL LOW (ref 26.0–34.0)
MCHC: 30.2 g/dL — ABNORMAL LOW (ref 32.0–36.0)
MCV: 70.1 fL — ABNORMAL LOW (ref 80.0–100.0)
PLATELETS: 228 10*3/uL (ref 150–440)
RBC: 3.48 MIL/uL — AB (ref 3.80–5.20)
RDW: 21.8 % — AB (ref 11.5–14.5)
WBC: 12.4 10*3/uL — AB (ref 3.6–11.0)

## 2017-10-25 MED ORDER — SERTRALINE HCL 25 MG PO TABS
50.0000 mg | ORAL_TABLET | Freq: Every day | ORAL | Status: DC
  Administered 2017-10-26: 50 mg via ORAL
  Filled 2017-10-25: qty 2

## 2017-10-25 MED ORDER — MEDROXYPROGESTERONE ACETATE 150 MG/ML IM SUSP
150.0000 mg | Freq: Once | INTRAMUSCULAR | Status: DC
  Filled 2017-10-25: qty 1

## 2017-10-25 MED ORDER — FERROUS SULFATE 325 (65 FE) MG PO TABS
325.0000 mg | ORAL_TABLET | Freq: Two times a day (BID) | ORAL | Status: DC
  Administered 2017-10-26: 325 mg via ORAL
  Filled 2017-10-25: qty 1

## 2017-10-25 NOTE — Progress Notes (Signed)
Uneventful night. Alert and oriented with flat affect. Fundus Firm at U/-2 and lochial flow is scant. Caring for self and Infant with encouragement. Has stated pain control with scheduled Ibuprofen. Expresses desire to be discharged this A.M.

## 2017-10-25 NOTE — Progress Notes (Signed)
Pt. Is alert and oriented with flat affect. She does not make eye contact and offers very little verbal response to questions. I have had to wake her to feed Infant and she has required assistance with obtaining an effective latch and sustaining that latch. I asked Pt. If she is feeling O.K. And she states yes. Sig. Other is at the bedside.

## 2017-10-25 NOTE — Anesthesia Postprocedure Evaluation (Signed)
Anesthesia Post Note  Patient: Audrey Beard  Procedure(s) Performed: AN AD HOC LABOR EPIDURAL  Patient location during evaluation: Mother Baby Anesthesia Type: Epidural Level of consciousness: awake, awake and alert, oriented and patient cooperative Pain management: pain level controlled Vital Signs Assessment: post-procedure vital signs reviewed and stable Respiratory status: spontaneous breathing, nonlabored ventilation and respiratory function stable Cardiovascular status: stable Postop Assessment: no headache, no backache and adequate PO intake Anesthetic complications: no     Last Vitals:  Vitals:   10/24/17 2042 10/24/17 2347  BP: 127/80 134/80  Pulse: 88 86  Resp: 16 16  Temp: 36.8 C 36.8 C  SpO2: 100% 99%    Last Pain:  Vitals:   10/25/17 0240  TempSrc:   PainSc: 0-No pain                 Edgar Corrigan,  Shardai Star R

## 2017-10-25 NOTE — Progress Notes (Signed)
   10/25/17 1515  Clinical Encounter Type  Visited With Patient;Other (Comment) (visitor from patient church)  Visit Type Initial  Referral From Nurse  Consult/Referral To Chaplain  Spiritual Encounters  Spiritual Needs Emotional   While rounding on the unit, staff suggested chaplain check in with patient.  Patient had visitor from church; patient open to chaplain following up another time.

## 2017-10-25 NOTE — Lactation Note (Signed)
This note was copied from a baby's chart. Lactation Consultation Note  Patient Name: Audrey Theodoro GristKayla Spillers [id number]'s Date: 10/25/2017  Mom no longer interested in breast feeding   Maternal Data    Feeding Feeding Type: Bottle Fed - Formula Nipple Type: Slow - flow  LATCH Score                   Interventions    Lactation Tools Discussed/Used     Consult Status      Louis MeckelWilliams, Tanis Burnley Kay 10/25/2017, 3:25 PM

## 2017-10-25 NOTE — Progress Notes (Signed)
Post Partum Day 1 Subjective: no complaints  Objective: Blood pressure 131/84, pulse 74, temperature (!) 97.5 F (36.4 C), temperature source Oral, resp. rate 18, height 5\' 3"  (1.6 m), weight 69.9 kg (154 lb), last menstrual period 01/21/2017, SpO2 98 %, unknown if currently breastfeeding.  Physical Exam:  General: alert and appears stated age Lochia: appropriate Uterine Fundus: firm  Recent Labs    10/24/17 0209 10/25/17 0430  HGB 8.8* 7.4*  HCT 29.2* 24.4*    Assessment/Plan: Plan for discharge tomorrow  Social work consult yesterday, per their note patient did not seem to engage with them Psychiatry consult on 3/25: no risk to self, recommend starting zoloft 50mg  daily, f;u with [facility], no concerns Acute on chronic blood loss anemia: on iron po BID. No vital sign abnormality   LOS: 1 day   Audrey DouglasBethany Adden Beard 10/25/2017, 10:24 PM
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
## 2017-10-26 LAB — RPR: RPR: NONREACTIVE

## 2017-10-26 MED ORDER — FERROUS SULFATE 325 (65 FE) MG PO TABS: 325.0000 mg | ORAL_TABLET | Freq: Two times a day (BID) | ORAL | 3 refills | Status: DC

## 2017-10-26 MED ORDER — INFLUENZA VAC SPLIT HIGH-DOSE 0.5 ML IM SUSY: 0.5000 mL | PREFILLED_SYRINGE | INTRAMUSCULAR | Status: DC

## 2017-10-26 NOTE — Progress Notes (Signed)
Pt discharged with infant.  Discharge instructions, prescriptions and follow up appointment given to and reviewed with pt. Pt verbalized understanding. Escorted out by auxillary. 

## 2017-10-26 NOTE — Discharge Instructions (Signed)
Anemia Anemia is a condition in which you do not have enough red blood cells or hemoglobin. Hemoglobin is a substance in red blood cells that carries oxygen. When you do not have enough red blood cells or hemoglobin (are anemic), your body cannot get enough oxygen and your organs may not work properly. As a result, you may feel very tired or have other problems. What are the causes? Common causes of anemia include:  Excessive bleeding. Anemia can be caused by excessive bleeding inside or outside the body, including bleeding from the intestine or from periods in women.  Poor nutrition.  Long-lasting (chronic) kidney, thyroid, and liver disease.  Bone marrow disorders.  Cancer and treatments for cancer.  HIV (human immunodeficiency virus) and AIDS (acquired immunodeficiency syndrome).  Treatments for HIV and AIDS.  Spleen problems.  Blood disorders.  Infections, medicines, and autoimmune disorders that destroy red blood cells.  What are the signs or symptoms? Symptoms of this condition include:  Minor weakness.  Dizziness.  Headache.  Feeling heartbeats that are irregular or faster than normal (palpitations).  Shortness of breath, especially with exercise.  Paleness.  Cold sensitivity.  Indigestion.  Nausea.  Difficulty sleeping.  Difficulty concentrating.  Symptoms may occur suddenly or develop slowly. If your anemia is mild, you may not have symptoms. How is this diagnosed? This condition is diagnosed based on:  Blood tests.  Your medical history.  A physical exam.  Bone marrow biopsy.  Your health care provider may also check your stool (feces) for blood and may do additional testing to look for the cause of your bleeding. You may also have other tests, including:  Imaging tests, such as a CT scan or MRI.  Endoscopy.  Colonoscopy.  How is this treated? Treatment for this condition depends on the cause. If you continue to lose a lot of blood,  you may need to be treated at a hospital. Treatment may include:  Taking supplements of iron, vitamin T02, or folic acid.  Taking a hormone medicine (erythropoietin) that can help to stimulate red blood cell growth.  Having a blood transfusion. This may be needed if you lose a lot of blood.  Making changes to your diet.  Having surgery to remove your spleen.  Follow these instructions at home:  Take over-the-counter and prescription medicines only as told by your health care provider.  Take supplements only as told by your health care provider.  Follow any diet instructions that you were given.  Keep all follow-up visits as told by your health care provider. This is important. Contact a health care provider if:  You develop new bleeding anywhere in the body. Get help right away if:  You are very weak.  You are short of breath.  You have pain in your abdomen or chest.  You are dizzy or feel faint.  You have trouble concentrating.  You have bloody or black, tarry stools.  You vomit repeatedly or you vomit up blood. Summary  Anemia is a condition in which you do not have enough red blood cells or enough of a substance in your red blood cells that carries oxygen (hemoglobin).  Symptoms may occur suddenly or develop slowly.  If your anemia is mild, you may not have symptoms.  This condition is diagnosed with blood tests as well as a medical history and physical exam. Other tests may be needed.  Treatment for this condition depends on the cause of the anemia. This information is not intended to replace advice  given to you by your health care provider. Make sure you discuss any questions you have with your health care provider. Document Released: 08/27/2004 Document Revised: 08/21/2016 Document Reviewed: 08/21/2016 Elsevier Interactive Patient Education  2018 [employer]. Please call your doctor or return to the ER if you experience any chest pains, shortness of  breath, dizziness, visual changes, fever greater than 101, any heavy bleeding (saturating more than 1 pad per hour), large clots, or foul smelling discharge, any worsening abdominal pain and cramping that is not controlled by pain medication, or any signs of postpartum depression. No tampons, enemas, douches, or sexual intercourse for 6 weeks. Also avoid tub baths, hot tubs, or swimming for 6 weeks.

## 2018-03-09 ENCOUNTER — Encounter: Payer: Self-pay | Admitting: Emergency Medicine

## 2018-03-09 ENCOUNTER — Emergency Department: Payer: Medicaid Other

## 2018-03-09 ENCOUNTER — Emergency Department
Admission: EM | Admit: 2018-03-09 | Discharge: 2018-03-09 | Disposition: A | Discharge status: Home / Self Care | Payer: Medicaid Other | Attending: Emergency Medicine | Admitting: Emergency Medicine

## 2018-03-09 ENCOUNTER — Other Ambulatory Visit: Payer: Self-pay

## 2018-03-09 DIAGNOSIS — S20222A Contusion of left back wall of thorax, initial encounter: Secondary | ICD-10-CM | POA: Insufficient documentation

## 2018-03-09 DIAGNOSIS — S40021A Contusion of right upper arm, initial encounter: Secondary | ICD-10-CM | POA: Insufficient documentation

## 2018-03-09 DIAGNOSIS — Z3A12 12 weeks gestation of pregnancy: Secondary | ICD-10-CM | POA: Insufficient documentation

## 2018-03-09 DIAGNOSIS — Z79899 Other long term (current) drug therapy: Secondary | ICD-10-CM | POA: Diagnosis not present

## 2018-03-09 DIAGNOSIS — Y929 Unspecified place or not applicable: Secondary | ICD-10-CM | POA: Diagnosis not present

## 2018-03-09 DIAGNOSIS — Y999 Unspecified external cause status: Secondary | ICD-10-CM | POA: Insufficient documentation

## 2018-03-09 DIAGNOSIS — Y939 Activity, unspecified: Secondary | ICD-10-CM | POA: Diagnosis not present

## 2018-03-09 DIAGNOSIS — R1030 Lower abdominal pain, unspecified: Secondary | ICD-10-CM

## 2018-03-09 DIAGNOSIS — O9A211 Injury, poisoning and certain other consequences of external causes complicating pregnancy, first trimester: Secondary | ICD-10-CM | POA: Insufficient documentation

## 2018-03-09 DIAGNOSIS — M542 Cervicalgia: Secondary | ICD-10-CM | POA: Diagnosis not present

## 2018-03-09 DIAGNOSIS — F17210 Nicotine dependence, cigarettes, uncomplicated: Secondary | ICD-10-CM | POA: Diagnosis not present

## 2018-03-09 LAB — HCG, QUANTITATIVE, PREGNANCY: hCG, Beta Chain, Quant, S: 161558 m[IU]/mL — ABNORMAL HIGH (ref <5)

## 2018-03-09 LAB — POCT PREGNANCY, URINE: Preg Test, Ur: POSITIVE — AB

## 2018-03-09 MED ORDER — COMPLETENATE 29-1 MG PO CHEW: 1.0000 | CHEWABLE_TABLET | Freq: Every day | ORAL | 0 refills | Status: DC

## 2018-03-09 MED ORDER — ACETAMINOPHEN 500 MG PO TABS
1000.0000 mg | ORAL_TABLET | Freq: Once | ORAL | Status: AC
  Administered 2018-03-09: 1000 mg via ORAL
  Filled 2018-03-09: qty 2

## 2018-03-09 NOTE — Discharge Instructions (Signed)
Please take prenatal vitamins as prescribed.  Use Tylenol as needed for pain.  Call trials to clinic or Dr. Bonney AidStaebler to schedule follow-up.  Return to the ER for any pelvic pain, bleeding, worsening symptoms or urgent changes in health.
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
## 2018-03-18 ENCOUNTER — Emergency Department: Payer: Medicaid Other

## 2018-03-18 ENCOUNTER — Emergency Department
Admission: EM | Admit: 2018-03-18 | Discharge: 2018-03-18 | Disposition: A | Discharge status: Home / Self Care | Payer: Medicaid Other | Attending: Emergency Medicine | Admitting: Emergency Medicine

## 2018-03-18 ENCOUNTER — Other Ambulatory Visit: Payer: Self-pay

## 2018-03-18 ENCOUNTER — Encounter: Payer: Self-pay | Admitting: Emergency Medicine

## 2018-03-18 DIAGNOSIS — O9989 Other specified diseases and conditions complicating pregnancy, childbirth and the puerperium: Secondary | ICD-10-CM | POA: Diagnosis present

## 2018-03-18 DIAGNOSIS — N10 Acute tubulo-interstitial nephritis: Secondary | ICD-10-CM | POA: Diagnosis not present

## 2018-03-18 DIAGNOSIS — O2302 Infections of kidney in pregnancy, second trimester: Secondary | ICD-10-CM | POA: Diagnosis not present

## 2018-03-18 DIAGNOSIS — O219 Vomiting of pregnancy, unspecified: Secondary | ICD-10-CM | POA: Diagnosis not present

## 2018-03-18 DIAGNOSIS — F17210 Nicotine dependence, cigarettes, uncomplicated: Secondary | ICD-10-CM | POA: Insufficient documentation

## 2018-03-18 DIAGNOSIS — O99332 Smoking (tobacco) complicating pregnancy, second trimester: Secondary | ICD-10-CM | POA: Insufficient documentation

## 2018-03-18 DIAGNOSIS — Z3A14 14 weeks gestation of pregnancy: Secondary | ICD-10-CM | POA: Insufficient documentation

## 2018-03-18 DIAGNOSIS — N23 Unspecified renal colic: Secondary | ICD-10-CM

## 2018-03-18 DIAGNOSIS — F329 Major depressive disorder, single episode, unspecified: Secondary | ICD-10-CM | POA: Diagnosis not present

## 2018-03-18 LAB — CBC
HCT: 30.1 % — ABNORMAL LOW (ref 35.0–47.0)
Hemoglobin: 10.2 g/dL — ABNORMAL LOW (ref 12.0–16.0)
MCH: 27.8 pg (ref 26.0–34.0)
MCHC: 33.9 g/dL (ref 32.0–36.0)
MCV: 82 fL (ref 80.0–100.0)
PLATELETS: 250 10*3/uL (ref 150–440)
RBC: 3.67 MIL/uL — AB (ref 3.80–5.20)
RDW: 18.3 % — ABNORMAL HIGH (ref 11.5–14.5)
WBC: 10.5 10*3/uL (ref 3.6–11.0)

## 2018-03-18 LAB — COMPREHENSIVE METABOLIC PANEL
ALBUMIN: 4 g/dL (ref 3.5–5.0)
ALT: 31 U/L (ref 0–44)
AST: 26 U/L (ref 15–41)
Alkaline Phosphatase: 47 U/L (ref 38–126)
Anion gap: 8 (ref 5–15)
BUN: 7 mg/dL (ref 6–20)
CALCIUM: 9 mg/dL (ref 8.9–10.3)
CHLORIDE: 111 mmol/L (ref 98–111)
CO2: 19 mmol/L — AB (ref 22–32)
CREATININE: 0.65 mg/dL (ref 0.44–1.00)
GFR calc Af Amer: >60 mL/min (ref >60)
GFR calc non Af Amer: >60 mL/min (ref >60)
GLUCOSE: 105 mg/dL — AB (ref 70–99)
Potassium: 3.6 mmol/L (ref 3.5–5.1)
SODIUM: 138 mmol/L (ref 135–145)
Total Bilirubin: 1.4 mg/dL — ABNORMAL HIGH (ref 0.3–1.2)
Total Protein: 7.3 g/dL (ref 6.5–8.1)

## 2018-03-18 LAB — URINALYSIS, COMPLETE (UACMP) WITH MICROSCOPIC
BILIRUBIN URINE: NEGATIVE
Glucose, UA: NEGATIVE mg/dL
Ketones, ur: 5 mg/dL — AB
Nitrite: NEGATIVE
PH: 6 (ref 5.0–8.0)
Protein, ur: NEGATIVE mg/dL
Specific Gravity, Urine: 1.018 (ref 1.005–1.030)

## 2018-03-18 LAB — HCG, QUANTITATIVE, PREGNANCY: HCG, BETA CHAIN, QUANT, S: 113539 m[IU]/mL — AB (ref <5)

## 2018-03-18 LAB — LIPASE, BLOOD: Lipase: 44 U/L (ref 11–51)

## 2018-03-18 MED ORDER — SODIUM CHLORIDE 0.9 % IV BOLUS
1000.0000 mL | Freq: Once | INTRAVENOUS | Status: AC
  Administered 2018-03-18: 1000 mL via INTRAVENOUS

## 2018-03-18 MED ORDER — METOCLOPRAMIDE HCL 5 MG/ML IJ SOLN
10.0000 mg | Freq: Once | INTRAMUSCULAR | Status: AC
  Administered 2018-03-18: 10 mg via INTRAVENOUS
  Filled 2018-03-18: qty 2

## 2018-03-18 MED ORDER — HYDROCODONE-ACETAMINOPHEN 5-325 MG PO TABS: 1.0000 | ORAL_TABLET | Freq: Four times a day (QID) | ORAL | 0 refills | Status: AC | PRN

## 2018-03-18 MED ORDER — FENTANYL CITRATE (PF) 100 MCG/2ML IJ SOLN
50.0000 ug | Freq: Once | INTRAMUSCULAR | Status: AC
  Administered 2018-03-18: 50 ug via INTRAVENOUS
  Filled 2018-03-18: qty 2

## 2018-03-18 MED ORDER — CEPHALEXIN 500 MG PO CAPS: 500.0000 mg | ORAL_CAPSULE | Freq: Three times a day (TID) | ORAL | 0 refills | Status: AC

## 2018-03-18 MED ORDER — METOCLOPRAMIDE HCL 10 MG PO TABS: 10.0000 mg | ORAL_TABLET | Freq: Three times a day (TID) | ORAL | 0 refills | Status: DC

## 2018-03-18 MED ORDER — SODIUM CHLORIDE 0.9 % IV SOLN
1.0000 g | Freq: Once | INTRAVENOUS | Status: AC
  Administered 2018-03-18: 1 g via INTRAVENOUS
  Filled 2018-03-18: qty 10

## 2018-03-18 MED ORDER — ONDANSETRON HCL 4 MG/2ML IJ SOLN
4.0000 mg | Freq: Once | INTRAMUSCULAR | Status: AC
  Administered 2018-03-18: 4 mg via INTRAVENOUS
  Filled 2018-03-18: qty 2

## 2018-03-18 NOTE — Discharge Instructions (Signed)
Please call and schedule an appointment with gynecology as soon as possible. Return to the ER if your pain gets worse and not well treated with the pain medication. Or, if you can not keep fluids down.

## 2018-03-18 NOTE — ED Notes (Signed)
See triage note   States she woke up with some right flank discomfort with intermittent abd pain  Also has had some n/v  Last time vomited was after getting to ED this am  Denies any vaginal bleeding

## 2018-03-18 NOTE — ED Triage Notes (Signed)
Right side lower abd pain.  [redacted] weeks pregnant.  No bleeding.  +nausea-vom x 5 today.  zofran 4 mg IV en route.  Glu 95, hr 70s.
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
## 2018-05-17 DIAGNOSIS — F17200 Nicotine dependence, unspecified, uncomplicated: Secondary | ICD-10-CM

## 2018-05-17 HISTORY — DX: Nicotine dependence, unspecified, uncomplicated: F17.200

## 2018-05-18 ENCOUNTER — Other Ambulatory Visit (HOSPITAL_COMMUNITY): Payer: Self-pay | Admitting: Family Medicine

## 2018-05-18 DIAGNOSIS — Z3480 Encounter for supervision of other normal pregnancy, unspecified trimester: Secondary | ICD-10-CM

## 2018-05-24 ENCOUNTER — Ambulatory Visit: Payer: Medicaid Other

## 2018-05-26 ENCOUNTER — Ambulatory Visit
Admission: RE | Admit: 2018-05-26 | Discharge: 2018-05-26 | Disposition: A | Discharge status: Home / Self Care | Payer: Medicaid Other | Source: Ambulatory Visit | Attending: Family Medicine | Admitting: Family Medicine

## 2018-05-26 DIAGNOSIS — Z3482 Encounter for supervision of other normal pregnancy, second trimester: Secondary | ICD-10-CM | POA: Insufficient documentation

## 2018-05-26 DIAGNOSIS — Z3480 Encounter for supervision of other normal pregnancy, unspecified trimester: Secondary | ICD-10-CM

## 2018-08-03 NOTE — L&D Delivery Note (Signed)
Delivery Note At 10:51 PM a viable and healthy baby boy "Renne Musca" was delivered via Vaginal, Spontaneous (Presentation: LOA  ).  APGAR: 8,9 ; weight pending.   Placenta status:expressed.  Cord:3vc  with the following complications: nuchal loose x1  Anesthesia:  epidural Episiotomy:  no Lacerations:  none Suture Repair: n/a Est. Blood Loss (mL):    Mom to postpartum.  Baby to Couplet care / Skin to Skin.  22yo [id number] at 40+0wks iol for Cat II strip in triage with variable decels. COOK cath and pitocin of 48mu/min effective to fully dilated rapidly. AROM for clear fluid shortly before delivery. She pushed over intact perineum and easily delivered fetal head with a loose nuchal x1, reduced on the perineum by CNM-S. Body and shoulder followed immediately and placed on maternal abdomen. Delayed cord clamping and then cut by FOB. Baby was vigorous and crying promptly. Fundal massage for placenta and no tears. Tolerated well.  Christeen Douglas 09/21/2018, 10:59 PM

## 2018-08-21 ENCOUNTER — Inpatient Hospital Stay
Admission: EM | Admit: 2018-08-21 | Discharge: 2018-08-22 | DRG: 832 | Disposition: A | Discharge status: Home / Self Care | Payer: Medicaid Other | Attending: Obstetrics and Gynecology | Admitting: Obstetrics and Gynecology

## 2018-08-21 ENCOUNTER — Other Ambulatory Visit: Payer: Self-pay

## 2018-08-21 DIAGNOSIS — O23593 Infection of other part of genital tract in pregnancy, third trimester: Secondary | ICD-10-CM | POA: Diagnosis present

## 2018-08-21 DIAGNOSIS — O98313 Other infections with a predominantly sexual mode of transmission complicating pregnancy, third trimester: Secondary | ICD-10-CM | POA: Diagnosis present

## 2018-08-21 DIAGNOSIS — O26893 Other specified pregnancy related conditions, third trimester: Secondary | ICD-10-CM | POA: Diagnosis present

## 2018-08-21 DIAGNOSIS — O99333 Smoking (tobacco) complicating pregnancy, third trimester: Secondary | ICD-10-CM | POA: Diagnosis present

## 2018-08-21 DIAGNOSIS — R109 Unspecified abdominal pain: Secondary | ICD-10-CM | POA: Diagnosis present

## 2018-08-21 DIAGNOSIS — O99019 Anemia complicating pregnancy, unspecified trimester: Secondary | ICD-10-CM

## 2018-08-21 DIAGNOSIS — D509 Iron deficiency anemia, unspecified: Secondary | ICD-10-CM | POA: Diagnosis present

## 2018-08-21 DIAGNOSIS — O0933 Supervision of pregnancy with insufficient antenatal care, third trimester: Secondary | ICD-10-CM

## 2018-08-21 DIAGNOSIS — Z3A35 35 weeks gestation of pregnancy: Secondary | ICD-10-CM

## 2018-08-21 DIAGNOSIS — O99013 Anemia complicating pregnancy, third trimester: Secondary | ICD-10-CM | POA: Diagnosis present

## 2018-08-21 DIAGNOSIS — B9689 Other specified bacterial agents as the cause of diseases classified elsewhere: Secondary | ICD-10-CM | POA: Diagnosis present

## 2018-08-21 DIAGNOSIS — F17210 Nicotine dependence, cigarettes, uncomplicated: Secondary | ICD-10-CM | POA: Diagnosis present

## 2018-08-21 DIAGNOSIS — A6000 Herpesviral infection of urogenital system, unspecified: Secondary | ICD-10-CM | POA: Diagnosis present

## 2018-08-21 LAB — URINALYSIS, COMPLETE (UACMP) WITH MICROSCOPIC
Bilirubin Urine: NEGATIVE
Glucose, UA: NEGATIVE mg/dL
Hgb urine dipstick: NEGATIVE
Ketones, ur: 20 mg/dL — AB
Nitrite: NEGATIVE
PROTEIN: NEGATIVE mg/dL
Specific Gravity, Urine: 1.019 (ref 1.005–1.030)
pH: 6 (ref 5.0–8.0)

## 2018-08-21 LAB — DIFFERENTIAL
Abs Immature Granulocytes: 0.06 10*3/uL (ref 0.00–0.07)
BASOS PCT: 0 %
Basophils Absolute: 0 10*3/uL (ref 0.0–0.1)
EOS ABS: 0 10*3/uL (ref 0.0–0.5)
Eosinophils Relative: 0 %
Immature Granulocytes: 1 %
LYMPHS ABS: 0.9 10*3/uL (ref 0.7–4.0)
Lymphocytes Relative: 9 %
MONOS PCT: 5 %
Monocytes Absolute: 0.5 10*3/uL (ref 0.1–1.0)
NEUTROS PCT: 85 %
Neutro Abs: 8.5 10*3/uL — ABNORMAL HIGH (ref 1.7–7.7)

## 2018-08-21 LAB — WET PREP, GENITAL
Sperm: NONE SEEN
Trich, Wet Prep: NONE SEEN
Yeast Wet Prep HPF POC: NONE SEEN

## 2018-08-21 LAB — CBC
HCT: 28.4 % — ABNORMAL LOW (ref 36.0–46.0)
Hemoglobin: 8.1 g/dL — ABNORMAL LOW (ref 12.0–15.0)
MCH: 20.8 pg — ABNORMAL LOW (ref 26.0–34.0)
MCHC: 28.5 g/dL — ABNORMAL LOW (ref 30.0–36.0)
MCV: 72.8 fL — AB (ref 80.0–100.0)
PLATELETS: 254 10*3/uL (ref 150–400)
RBC: 3.9 MIL/uL (ref 3.87–5.11)
RDW: 18.7 % — ABNORMAL HIGH (ref 11.5–15.5)
WBC: 10 10*3/uL (ref 4.0–10.5)
nRBC: 0 % (ref 0.0–0.2)

## 2018-08-21 LAB — CHLAMYDIA/NGC RT PCR (ARMC ONLY)
Chlamydia Tr: NOT DETECTED
N gonorrhoeae: NOT DETECTED

## 2018-08-21 LAB — URINE DRUG SCREEN, QUALITATIVE (ARMC ONLY)
AMPHETAMINES, UR SCREEN: NOT DETECTED
Barbiturates, Ur Screen: NOT DETECTED
Benzodiazepine, Ur Scrn: NOT DETECTED
Cannabinoid 50 Ng, Ur ~~LOC~~: POSITIVE — AB
Cocaine Metabolite,Ur ~~LOC~~: NOT DETECTED
MDMA (Ecstasy)Ur Screen: NOT DETECTED
Methadone Scn, Ur: NOT DETECTED
Opiate, Ur Screen: NOT DETECTED
Phencyclidine (PCP) Ur S: NOT DETECTED
Tricyclic, Ur Screen: NOT DETECTED

## 2018-08-21 LAB — RAPID HIV SCREEN (HIV 1/2 AB+AG)
HIV 1/2 Antibodies: NONREACTIVE
HIV-1 P24 Antigen - HIV24: NONREACTIVE

## 2018-08-21 LAB — PREPARE RBC (CROSSMATCH)

## 2018-08-21 MED ORDER — SODIUM CHLORIDE 0.9% IV SOLUTION
Freq: Once | INTRAVENOUS | Status: AC
  Administered 2018-08-21: 19:00:00 via INTRAVENOUS

## 2018-08-21 MED ORDER — ACETAMINOPHEN 325 MG PO TABS
650.0000 mg | ORAL_TABLET | Freq: Once | ORAL | Status: AC
  Administered 2018-08-21: 650 mg via ORAL
  Filled 2018-08-21: qty 2

## 2018-08-21 MED ORDER — ONDANSETRON 4 MG PO TBDP
4.0000 mg | ORAL_TABLET | Freq: Four times a day (QID) | ORAL | Status: DC | PRN
  Administered 2018-08-22: 4 mg via ORAL
  Filled 2018-08-21: qty 1

## 2018-08-21 MED ORDER — DIPHENHYDRAMINE HCL 25 MG PO CAPS
25.0000 mg | ORAL_CAPSULE | Freq: Once | ORAL | Status: AC
  Administered 2018-08-21: 25 mg via ORAL
  Filled 2018-08-21: qty 1

## 2018-08-21 MED ORDER — LACTATED RINGERS IV SOLN
INTRAVENOUS | Status: DC
  Administered 2018-08-21: 1000 mL via INTRAVENOUS
  Administered 2018-08-21: 22:00:00 via INTRAVENOUS
  Administered 2018-08-21: 1000 mL via INTRAVENOUS
  Administered 2018-08-22 (×2): via INTRAVENOUS

## 2018-08-21 MED ORDER — VALACYCLOVIR HCL 500 MG PO TABS
500.0000 mg | ORAL_TABLET | Freq: Two times a day (BID) | ORAL | Status: DC
  Administered 2018-08-21 – 2018-08-22 (×3): 500 mg via ORAL
  Filled 2018-08-21 (×3): qty 1

## 2018-08-21 MED ORDER — PRENATAL MULTIVITAMIN CH
1.0000 | ORAL_TABLET | Freq: Every day | ORAL | Status: DC
  Administered 2018-08-22: 1 via ORAL
  Filled 2018-08-21: qty 1

## 2018-08-21 MED ORDER — FERROUS SULFATE 325 (65 FE) MG PO TABS
325.0000 mg | ORAL_TABLET | Freq: Two times a day (BID) | ORAL | Status: DC
  Administered 2018-08-21 – 2018-08-22 (×3): 325 mg via ORAL
  Filled 2018-08-21 (×3): qty 1

## 2018-08-21 MED ORDER — ONDANSETRON HCL 4 MG/2ML IJ SOLN
4.0000 mg | Freq: Four times a day (QID) | INTRAMUSCULAR | Status: DC
  Administered 2018-08-21 – 2018-08-22 (×3): 4 mg via INTRAVENOUS
  Filled 2018-08-21 (×3): qty 2

## 2018-08-21 NOTE — Plan of Care (Signed)
Alert and oriented with pleasant affect. Color sl. Pale, Skin W&d. BBS clear. States Positive Fetal movement, no vaginal bleeding or leakage of fluid, FHT is 143. Pt. Receiving Blood no no s/s complications. Positive pedal pulsus equal and strong with cap. Refill < 2 sec.
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
## 2018-08-21 NOTE — H&P (Addendum)
Audrey Beard is a 23 y.o. female. She is at [redacted]w[redacted]d gestation. No LMP recorded. Patient is pregnant. Estimated Date of Delivery: 09/21/18  Prenatal care site:  [facility]- pt unable to verbalize when last prenatal visit was. States "its been a while"   Current pregnancy complicated by:  1. Limited prenatal care- no records available; dating per [place] at [redacted]w[redacted]d in ER 2. Hx HSV 3. Close interconceptual spacing, last SVD 10/24/2017  4. Hx anemia  Chief complaint: abdominal pain  Location: mid abdomen Onset/timing: starting this morning Duration: constant Quality: crampy Severity: n/a Aggravating or alleviating conditions: denies any sick contacts in family, lives with her mother and 3 children age 69, 2 and 46mos old.  Associated signs/symptoms: Nausea- has vomited x 3, and multiple episodes of diarrhea. Positive Fetal movement, but decreased today.  Context:  S: Resting comfortably. no CTX, no VB.no LOF,  Active fetal movement noted. Denies: HA, visual changes, SOB, or RUQ/epigastric pain  Maternal Medical History:   Past Medical History:  Diagnosis Date  . Depression   . Pregnancy induced hypertension   Anemia hx HSV  Past Surgical History:  Procedure Laterality Date  . NO PAST SURGERIES      No Known Allergies  Prior to Admission medications   Medication Sig Start Date End Date Taking? Authorizing Provider  ferrous sulfate 325 (65 FE) MG tablet Take 1 tablet (325 mg total) by mouth 2 (two) times daily with a meal. 10/26/17   Sharee Pimple, CNM  metoCLOPramide (REGLAN) 10 MG tablet Take 1 tablet (10 mg total) by mouth 3 (three) times daily with meals for 30 doses. 03/18/18 03/28/18  Triplett, Kasandra Knudsen, FNP  prenatal vitamin w/FE, FA (NATACHEW) 29-1 MG CHEW chewable tablet Chew 1 tablet by mouth daily at 12 noon. 03/09/18   Evon Slack, PA-C  sertraline (ZOLOFT) 50 MG tablet Take 1 tablet (50 mg total) by mouth daily. 09/24/17   Clapacs, Jackquline Denmark, MD      Social History: She   reports that she has been smoking cigarettes. She has a 1.00 pack-year smoking history. She has never used smokeless tobacco. She reports current drug use. Drug: Marijuana. She reports that she does not drink alcohol.  Family History: no history of gyn cancers  Review of Systems: A full review of systems was performed and negative except as noted in the HPI.     O:  BP 116/68 (BP Location: Right Arm)   Pulse (!) 108   Temp 98.2 F (36.8 C) (Oral)   Resp 16  Results for orders placed or performed during the hospital encounter of 08/21/18 (from the past 48 hour(s))  Wet prep, genital   Collection Time: 08/21/18  4:00 PM  Result Value Ref Range   Yeast Wet Prep HPF POC NONE SEEN NONE SEEN   Trich, Wet Prep NONE SEEN NONE SEEN   Clue Cells Wet Prep HPF POC PRESENT (A) NONE SEEN   WBC, Wet Prep HPF POC PRESENT (A) NONE SEEN   Sperm NONE SEEN   Urine Drug Screen, Qualitative ([facility] only)   Collection Time: 08/21/18  4:00 PM  Result Value Ref Range   Tricyclic, Ur Screen NONE DETECTED NONE DETECTED   Amphetamines, Ur Screen NONE DETECTED NONE DETECTED   MDMA (Ecstasy)Ur Screen NONE DETECTED NONE DETECTED   Cocaine Metabolite,Ur [place] NONE DETECTED NONE DETECTED   Opiate, Ur Screen NONE DETECTED NONE DETECTED   Phencyclidine (PCP) Ur S NONE DETECTED NONE DETECTED   Cannabinoid  50 Ng, Ur [place] POSITIVE (A) NONE DETECTED   Barbiturates, Ur Screen NONE DETECTED NONE DETECTED   Benzodiazepine, Ur Scrn NONE DETECTED NONE DETECTED   Methadone Scn, Ur NONE DETECTED NONE DETECTED  Urinalysis, Complete w Microscopic   Collection Time: 08/21/18  4:00 PM  Result Value Ref Range   Color, Urine YELLOW (A) YELLOW   APPearance HAZY (A) CLEAR   Specific Gravity, Urine 1.019 1.005 - 1.030   pH 6.0 5.0 - 8.0   Glucose, UA NEGATIVE NEGATIVE mg/dL   Hgb urine dipstick NEGATIVE NEGATIVE   Bilirubin Urine NEGATIVE NEGATIVE   Ketones, ur 20 (A) NEGATIVE mg/dL   Protein, ur NEGATIVE NEGATIVE mg/dL   Nitrite  NEGATIVE NEGATIVE   Leukocytes, UA SMALL (A) NEGATIVE   RBC / HPF 6-10 0 - 5 RBC/hpf   WBC, UA 21-50 0 - 5 WBC/hpf   Bacteria, UA RARE (A) NONE SEEN   Squamous Epithelial / LPF 11-20 0 - 5   Mucus PRESENT   CBC   Collection Time: 08/21/18  4:18 PM  Result Value Ref Range   WBC 10.0 4.0 - 10.5 K/uL   RBC 3.90 3.87 - 5.11 MIL/uL   Hemoglobin 8.1 (L) 12.0 - 15.0 g/dL   HCT 16.128.4 (L) 09.636.0 - 04.546.0 %   MCV 72.8 (L) 80.0 - 100.0 fL   MCH 20.8 (L) 26.0 - 34.0 pg   MCHC 28.5 (L) 30.0 - 36.0 g/dL   RDW 40.918.7 (H) 81.111.5 - 91.415.5 %   Platelets 254 150 - 400 K/uL   nRBC 0.0 0.0 - 0.2 %  Differential   Collection Time: 08/21/18  4:18 PM  Result Value Ref Range   Neutrophils Relative % 85 %   Neutro Abs 8.5 (H) 1.7 - 7.7 K/uL   Lymphocytes Relative 9 %   Lymphs Abs 0.9 0.7 - 4.0 K/uL   Monocytes Relative 5 %   Monocytes Absolute 0.5 0.1 - 1.0 K/uL   Eosinophils Relative 0 %   Eosinophils Absolute 0.0 0.0 - 0.5 K/uL   Basophils Relative 0 %   Basophils Absolute 0.0 0.0 - 0.1 K/uL   Immature Granulocytes 1 %   Abs Immature Granulocytes 0.06 0.00 - 0.07 K/uL  Rapid HIV screen (HIV 1/2 Ab+Ag)   Collection Time: 08/21/18  4:18 PM  Result Value Ref Range   HIV-1 P24 Antigen - HIV24 NON REACTIVE NON REACTIVE   HIV 1/2 Antibodies NON REACTIVE NON REACTIVE   Interpretation (HIV Ag Ab)      A non reactive test result means that HIV 1 or HIV 2 antibodies and HIV 1 p24 antigen were not detected in the specimen.  Type and screen   Collection Time: 08/21/18  4:18 PM  Result Value Ref Range   ABO/RH(D) PENDING    Antibody Screen PENDING    Sample Expiration      08/24/2018 Performed at [facility], [address] [place], [place] [address]      Constitutional: NAD, AAOx3  HE/ENT: extraocular movements grossly intact, moist mucous membranes CV: RRR PULM: nl respiratory effort, CTABL     Abd: gravid, non-tender, non-distended, soft, +BS     Ext: Non-tender, Nonedematous   Psych:  speech normal, flat affect, minimal eye contact.  Pelvic: SSE done- mod amt white DC noted, no erythema. Approx 1cm blister filled with cloudy fluid, left labia minora- pt denies pain/burning at site of lesion. Marland Kitchen.   GC/CT and wet prep sent  Cervix: SVE 1/50/-3  Fetal  monitoring: Cat I Appropriate for GA, reactive NST Baseline: 145bpm Variability: moderate Accelerations:  present x >2 Decelerations absent  TOCO: no UCs noted.     A/P: 23 y.o. 6848w4d here for antenatal surveillance for abdominal pain in preg, 3rd trimester.   Principle Diagnosis: 1. N/V in pregnancy- likely GI viral process 2. Limited prenatal care 3. HSV, outbreak noted on exam.    Preterm labor: not present.   Fetal Wellbeing: Reassuring Cat 1 tracing with Reactive NST   Zofran given for nausea, IV bolus; pt feeling much better after zofran.   Valtrex ordered for acute tx of outbreak, needs to continue with suppression.  Prenatal labs drawn: significant anemia with Hgb 8.1; d/w dr Elesa MassedWard, pt to have PRBC transfusion x 2 units, remain inpt and have Heme consult tomorrow.   Bacterial vaginosis- will treat with metrogel qHS  Transfer to Antepartum bed- daily NST.    Randa NgoRebecca A McVey, CNM 08/21/2018  5:23 PM

## 2018-08-22 ENCOUNTER — Inpatient Hospital Stay: Payer: Medicaid Other

## 2018-08-22 LAB — PREPARE RBC (CROSSMATCH)

## 2018-08-22 LAB — IRON AND TIBC
Iron: 19 ug/dL — ABNORMAL LOW (ref 28–170)
Saturation Ratios: 4 % — ABNORMAL LOW (ref 10.4–31.8)
TIBC: 543 ug/dL — ABNORMAL HIGH (ref 250–450)
UIBC: 524 ug/dL

## 2018-08-22 LAB — CBC
HCT: 26.1 % — ABNORMAL LOW (ref 36.0–46.0)
HEMOGLOBIN: 7.5 g/dL — AB (ref 12.0–15.0)
MCH: 21.2 pg — ABNORMAL LOW (ref 26.0–34.0)
MCHC: 28.7 g/dL — ABNORMAL LOW (ref 30.0–36.0)
MCV: 73.7 fL — ABNORMAL LOW (ref 80.0–100.0)
NRBC: 0 % (ref 0.0–0.2)
Platelets: 204 10*3/uL (ref 150–400)
RBC: 3.54 MIL/uL — ABNORMAL LOW (ref 3.87–5.11)
RDW: 19.5 % — ABNORMAL HIGH (ref 11.5–15.5)
WBC: 7.5 10*3/uL (ref 4.0–10.5)

## 2018-08-22 LAB — FERRITIN: Ferritin: 3 ng/mL — ABNORMAL LOW (ref 11–307)

## 2018-08-22 LAB — HEMOGLOBIN: Hemoglobin: 9.1 g/dL — ABNORMAL LOW (ref 12.0–15.0)

## 2018-08-22 LAB — TRANSFERRIN: TRANSFERRIN: 380 mg/dL (ref 192–382)

## 2018-08-22 LAB — VITAMIN B12: VITAMIN B 12: 108 pg/mL — AB (ref 180–914)

## 2018-08-22 MED ORDER — NICOTINE POLACRILEX 2 MG MT GUM: 2.0000 mg | CHEWING_GUM | OROMUCOSAL | 0 refills | Status: DC | PRN

## 2018-08-22 MED ORDER — METRONIDAZOLE 500 MG PO TABS: 500.0000 mg | ORAL_TABLET | Freq: Two times a day (BID) | ORAL | 0 refills | Status: AC

## 2018-08-22 MED ORDER — ACETAMINOPHEN 325 MG PO TABS
650.0000 mg | ORAL_TABLET | Freq: Once | ORAL | Status: AC
  Administered 2018-08-22: 650 mg via ORAL
  Filled 2018-08-22: qty 2

## 2018-08-22 MED ORDER — ONDANSETRON 4 MG PO TBDP: 4.0000 mg | ORAL_TABLET | Freq: Four times a day (QID) | ORAL | 1 refills | Status: DC | PRN

## 2018-08-22 MED ORDER — SODIUM CHLORIDE 0.9% IV SOLUTION
Freq: Once | INTRAVENOUS | Status: AC
  Administered 2018-08-22: 10:00:00 via INTRAVENOUS

## 2018-08-22 MED ORDER — WITCH HAZEL-GLYCERIN EX PADS: MEDICATED_PAD | CUTANEOUS | Status: DC | PRN

## 2018-08-22 MED ORDER — METRONIDAZOLE 500 MG PO TABS
500.0000 mg | ORAL_TABLET | Freq: Two times a day (BID) | ORAL | Status: DC
  Administered 2018-08-22 (×3): 500 mg via ORAL
  Filled 2018-08-22 (×3): qty 1

## 2018-08-22 MED ORDER — CYANOCOBALAMIN 1000 MCG/ML IJ SOLN
1000.0000 ug | Freq: Once | INTRAMUSCULAR | Status: AC
  Administered 2018-08-22: 1000 ug via INTRAMUSCULAR
  Filled 2018-08-22 (×2): qty 1

## 2018-08-22 MED ORDER — DIPHENHYDRAMINE HCL 25 MG PO CAPS
25.0000 mg | ORAL_CAPSULE | Freq: Once | ORAL | Status: AC
  Administered 2018-08-22: 25 mg via ORAL
  Filled 2018-08-22: qty 1

## 2018-08-22 MED ORDER — SODIUM CHLORIDE 0.9 % IV SOLN
INTRAVENOUS | Status: DC
  Administered 2018-08-22: 14:00:00 via INTRAVENOUS

## 2018-08-22 MED ORDER — ZOLPIDEM TARTRATE 5 MG PO TABS
5.0000 mg | ORAL_TABLET | Freq: Every evening | ORAL | Status: DC | PRN
  Administered 2018-08-22: 5 mg via ORAL
  Filled 2018-08-22: qty 1

## 2018-08-22 MED ORDER — VALACYCLOVIR HCL 500 MG PO TABS: 500.0000 mg | ORAL_TABLET | Freq: Two times a day (BID) | ORAL | 2 refills | Status: DC

## 2018-08-22 MED ORDER — CYANOCOBALAMIN 1000 MCG/ML IJ SOLN: 1000.0000 ug | Freq: Once | INTRAMUSCULAR | 0 refills | Status: AC

## 2018-08-22 MED ORDER — NICOTINE POLACRILEX 2 MG MT GUM
2.0000 mg | CHEWING_GUM | OROMUCOSAL | Status: DC | PRN
  Filled 2018-08-22 (×3): qty 1

## 2018-08-22 MED ORDER — DIBUCAINE 1 % RE OINT: TOPICAL_OINTMENT | RECTAL | Status: DC | PRN

## 2018-08-22 MED ORDER — DOCUSATE SODIUM 100 MG PO CAPS
100.0000 mg | ORAL_CAPSULE | Freq: Two times a day (BID) | ORAL | Status: DC
  Administered 2018-08-22: 100 mg via ORAL
  Filled 2018-08-22: qty 1

## 2018-08-22 MED ORDER — FERROUS SULFATE 325 (65 FE) MG PO TABS: 325.0000 mg | ORAL_TABLET | Freq: Three times a day (TID) | ORAL | Status: DC

## 2018-08-22 MED ORDER — DOCUSATE SODIUM 100 MG PO CAPS: 100.0000 mg | ORAL_CAPSULE | Freq: Two times a day (BID) | ORAL | 0 refills | Status: DC

## 2018-08-22 NOTE — Progress Notes (Signed)
Patient to Labor/Delivery via wheelchair by RN for ordered daily NST.

## 2018-08-22 NOTE — Progress Notes (Signed)
Pt. given flagyl and Ambien per CNM order. Category 1 strip. CT x 1 with U/I. Cervical exam unchanged since 08/21/18 with no discharge noted on pad. VSS. Patient will return to M/B.

## 2018-08-22 NOTE — Progress Notes (Signed)
Patient transported back to room via wheelchair by orderly. Reynold Bowen, RN 08/22/2018 9:20 AM

## 2018-08-22 NOTE — Progress Notes (Signed)
Updated MD on patient status including hemoglobin result and LSCW concerns related to flat affect.  MD in to assess patient.

## 2018-08-22 NOTE — Progress Notes (Signed)
Pt. Transferred to B/P.

## 2018-08-22 NOTE — Progress Notes (Signed)
Discharge instructions along with home medications and follow up gone over with patient. She verbalized that she understood instructions. No prescriptions given to patient. IV removed. Pt being discharged home on room air, no distress noted. Priyal Musquiz S Fenton, RN 
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
## 2018-08-22 NOTE — Progress Notes (Signed)
Pt returned to room via wheelchair by RN.

## 2018-08-22 NOTE — Progress Notes (Signed)
Pt. Transferred back to room 348. Denies cramping and/or tightening of abdomen. Denies vaginal discharge at this time.

## 2018-08-22 NOTE — Progress Notes (Signed)
To Ultrasound via wheelchair by [facility].

## 2018-08-22 NOTE — Progress Notes (Signed)
Complete report to M. Sweeny RN

## 2018-08-22 NOTE — Progress Notes (Signed)
Bonnell Public CNM notified that Pt. Had small amount of brownish drainage from vagina and Pt. With c/o lower abdominal, "tightening." Order to transfer Pt. To B/P for monitoring received.

## 2018-08-22 NOTE — Clinical Social Work Maternal (Addendum)
  CLINICAL SOCIAL WORK MATERNAL/CHILD NOTE  Patient Details  Name: MARYLAND STELL MRN: [id number] Date of Birth: 1996/04/07  Date:  08/22/2018  Clinical Social Worker Initiating Note:  Shela Leff MSW,LCSW Date/Time: Initiated:  08/22/18/      Child's Name:      Biological Parents:  Mother, Father   Need for Interpreter:  None   Reason for Referral:  Behavioral Health Concerns   Address:  [place] [address]    Phone number:  [phone] (home)     Additional phone number: none  Household Members/Support Persons (HM/SP):       HM/SP Name Relationship DOB or Age  HM/SP -1        HM/SP -2        HM/SP -3        HM/SP -4        HM/SP -5        HM/SP -6        HM/SP -7        HM/SP -8          Natural Supports (not living in the home):      Professional Supports: None   Employment:     Type of Work:     Education:      Homebound arranged:    Museum/gallery curator Resources:  Medicaid   Other Resources:  [facility]   Cultural/Religious Considerations Which May Impact Care:  none  Strengths:  Ability to meet basic needs , Compliance with medical plan , Home prepared for child    Psychotropic Medications:         Pediatrician:       Pediatrician List:   [place]      Pediatrician Fax Number:    Risk Factors/Current Problems:  Mental Health Concerns    Cognitive State:  Alert , Able to Concentrate    Mood/Affect:  Calm    CSW Assessment: CSW met with patient this morning as she was laying in her bed. Patient has not given birth as of yet.. Patient did not look CSW in the eye during assessment and began to [facility] when answering my initial questions. CSW asked her to speak up and she did. She answered questions appropriately but had a very flat affect. She answered in short sentences. When asked if she was sad about anything she stated she was not.  She stated that she lives with her mother and her other 3 children and this will be her 44th. She states that she has necessities for her newborn. She has [place] and is able to access transportation. CSW left her resources for [facility] for additional support. Patient was not very engaged in the conversation and CSW left her alone to rest. CSW notified patient's nurse of the concern for a flat affect and the nurse will discuss with the physician.   CSW Plan/Description:  Psychosocial Support and Ongoing Assessment of Needs    Shela Leff, LCSW 08/22/2018, 12:24 PM

## 2018-08-22 NOTE — Progress Notes (Signed)
CBC Results called to R. Mcvey CNM. HGB is 7.3 from 8.1 Post 1 U PRBC. Provider stated that H&H is lower because Pt. Is better hydrated and that she will most likely receive Blood later today after consult with Hem/Onc. Pt. Denies c/o. Has denied N/V this entire shift. Has voided WNL. Has not had any further vaginal discharge. States positive Fetal movement and denies abdominal pain and/or discomfort. Afeb. VSS. Color sl. Pale skin w&d. Cap. Refill < 2 sec.

## 2018-08-22 NOTE — OB Triage Note (Signed)
Pt. presents to L/D from M/B for monitoring. She has noted a different feeling that she describes as an "increase in movement". She noticed  brown discharge when wiping. No pain at this time. VSS. Will continue to monitor.

## 2018-08-22 NOTE — Discharge Instructions (Signed)
Please take  valtrex 500mg  twice daily until your delivery Flagyl 500mg  twice daily for 6 more days Iron 325mg  three times daily until your delivery, or as instructed by the hematologist. Colace 100mg  twice daily to prevent constipation  You will be scheduled with Maternal Fetal Medicine for another ultrasound in the next week You will also be scheduled with Hematology in the next week for your anemia.    Please keep these appointments.

## 2018-08-23 LAB — BPAM RBC
BLOOD PRODUCT EXPIRATION DATE: 202002122359
Blood Product Expiration Date: 202001282359
Blood Product Expiration Date: 202002122359
ISSUE DATE / TIME: 202001191834
ISSUE DATE / TIME: 202001201021
ISSUE DATE / TIME: 202001201339
UNIT TYPE AND RH: 6200
Unit Type and Rh: 600
Unit Type and Rh: 6200

## 2018-08-23 LAB — TYPE AND SCREEN
ABO/RH(D): AB POS
Antibody Screen: NEGATIVE
Unit division: 0
Unit division: 0
Unit division: 0

## 2018-08-23 LAB — RUBELLA SCREEN: Rubella: 1.05 index (ref >0.99)

## 2018-08-23 LAB — HEPATITIS B SURFACE ANTIGEN: Hepatitis B Surface Ag: NEGATIVE

## 2018-08-23 LAB — RPR: RPR Ser Ql: NONREACTIVE

## 2018-08-23 LAB — VARICELLA ZOSTER ANTIBODY, IGG: Varicella IgG: 1498 index (ref >165)

## 2018-08-23 NOTE — Discharge Summary (Signed)
Patient ID: Audrey Beard MRN: [id number] DOB/AGE: May 14, 1996 22 y.o.  LMP: 12/15/17 Estimated Date of Delivery: 09/21/18   Admit date: 08/21/2018 Discharge date: 08/23/2018  Admission Diagnoses: nausea, vomiting, genital herpes, bacterial vaginosis, smoking during pregnancy, anemia of pregnancy, high risk pregnancy in 3rd trimester  Discharge Diagnoses:  Same as above with fetal growth restriction  Prenatal Procedures: ultrasound  Consults: Neonatology, Maternal Fetal Medicine  Significant Diagnostic Studies:  Results for orders placed or performed during the hospital encounter of 08/21/18 (from the past 168 hour(s))  Chlamydia/NGC rt PCR ([facility] only)   Collection Time: 08/21/18  4:00 PM  Result Value Ref Range   Specimen source GC/Chlam URINE, RANDOM    Chlamydia Tr NOT DETECTED NOT DETECTED   N gonorrhoeae NOT DETECTED NOT DETECTED  Wet prep, genital   Collection Time: 08/21/18  4:00 PM  Result Value Ref Range   Yeast Wet Prep HPF POC NONE SEEN NONE SEEN   Trich, Wet Prep NONE SEEN NONE SEEN   Clue Cells Wet Prep HPF POC PRESENT (A) NONE SEEN   WBC, Wet Prep HPF POC PRESENT (A) NONE SEEN   Sperm NONE SEEN   Urine Drug Screen, Qualitative ([facility] only)   Collection Time: 08/21/18  4:00 PM  Result Value Ref Range   Tricyclic, Ur Screen NONE DETECTED NONE DETECTED   Amphetamines, Ur Screen NONE DETECTED NONE DETECTED   MDMA (Ecstasy)Ur Screen NONE DETECTED NONE DETECTED   Cocaine Metabolite,Ur [place] NONE DETECTED NONE DETECTED   Opiate, Ur Screen NONE DETECTED NONE DETECTED   Phencyclidine (PCP) Ur S NONE DETECTED NONE DETECTED   Cannabinoid 50 Ng, Ur  POSITIVE (A) NONE DETECTED   Barbiturates, Ur Screen NONE DETECTED NONE DETECTED   Benzodiazepine, Ur Scrn NONE DETECTED NONE DETECTED   Methadone Scn, Ur NONE DETECTED NONE DETECTED  Urinalysis, Complete w Microscopic   Collection Time: 08/21/18  4:00 PM  Result Value Ref Range   Color, Urine YELLOW (A) YELLOW   APPearance  HAZY (A) CLEAR   Specific Gravity, Urine 1.019 1.005 - 1.030   pH 6.0 5.0 - 8.0   Glucose, UA NEGATIVE NEGATIVE mg/dL   Hgb urine dipstick NEGATIVE NEGATIVE   Bilirubin Urine NEGATIVE NEGATIVE   Ketones, ur 20 (A) NEGATIVE mg/dL   Protein, ur NEGATIVE NEGATIVE mg/dL   Nitrite NEGATIVE NEGATIVE   Leukocytes, UA SMALL (A) NEGATIVE   RBC / HPF 6-10 0 - 5 RBC/hpf   WBC, UA 21-50 0 - 5 WBC/hpf   Bacteria, UA RARE (A) NONE SEEN   Squamous Epithelial / LPF 11-20 0 - 5   Mucus PRESENT   Hepatitis B surface antigen   Collection Time: 08/21/18  4:18 PM  Result Value Ref Range   Hepatitis B Surface Ag Negative Negative  Rubella screen   Collection Time: 08/21/18  4:18 PM  Result Value Ref Range   Rubella 1.05 Immune >0.99 index  RPR   Collection Time: 08/21/18  4:18 PM  Result Value Ref Range   RPR Ser Ql Non Reactive Non Reactive  CBC   Collection Time: 08/21/18  4:18 PM  Result Value Ref Range   WBC 10.0 4.0 - 10.5 K/uL   RBC 3.90 3.87 - 5.11 MIL/uL   Hemoglobin 8.1 (L) 12.0 - 15.0 g/dL   HCT 84.1 (L) 32.4 - 40.1 %   MCV 72.8 (L) 80.0 - 100.0 fL   MCH 20.8 (L) 26.0 - 34.0 pg   MCHC 28.5 (L) 30.0 - 36.0 g/dL  RDW 18.7 (H) 11.5 - 15.5 %   Platelets 254 150 - 400 K/uL   nRBC 0.0 0.0 - 0.2 %  Differential   Collection Time: 08/21/18  4:18 PM  Result Value Ref Range   Neutrophils Relative % 85 %   Neutro Abs 8.5 (H) 1.7 - 7.7 K/uL   Lymphocytes Relative 9 %   Lymphs Abs 0.9 0.7 - 4.0 K/uL   Monocytes Relative 5 %   Monocytes Absolute 0.5 0.1 - 1.0 K/uL   Eosinophils Relative 0 %   Eosinophils Absolute 0.0 0.0 - 0.5 K/uL   Basophils Relative 0 %   Basophils Absolute 0.0 0.0 - 0.1 K/uL   Immature Granulocytes 1 %   Abs Immature Granulocytes 0.06 0.00 - 0.07 K/uL  Rapid HIV screen (HIV 1/2 Ab+Ag)   Collection Time: 08/21/18  4:18 PM  Result Value Ref Range   HIV-1 P24 Antigen - HIV24 NON REACTIVE NON REACTIVE   HIV 1/2 Antibodies NON REACTIVE NON REACTIVE   Interpretation  (HIV Ag Ab)      A non reactive test result means that HIV 1 or HIV 2 antibodies and HIV 1 p24 antigen were not detected in the specimen.  Varicella zoster antibody, IgG   Collection Time: 08/21/18  4:18 PM  Result Value Ref Range   Varicella IgG 1,498 Immune >165 index  Type and screen   Collection Time: 08/21/18  4:18 PM  Result Value Ref Range   ABO/RH(D) AB POS    Antibody Screen NEG    Sample Expiration 08/24/2018    Unit Number [id number]    Blood Component Type RED CELLS,LR    Unit division 00    Status of Unit ISSUED,FINAL    Transfusion Status OK TO TRANSFUSE    Crossmatch Result Compatible    Unit Number [id number]    Blood Component Type RBC LR PHER1    Unit division 00    Status of Unit ISSUED    Transfusion Status OK TO TRANSFUSE    Crossmatch Result Compatible    Unit Number [id number]    Blood Component Type RED CELLS,LR    Unit division 00    Status of Unit ISSUED    Transfusion Status OK TO TRANSFUSE    Crossmatch Result      Compatible Performed at [facility], [address] [place], [place] [address]   [facility]   Collection Time: 08/21/18  4:18 PM  Result Value Ref Range   ISSUE DATE / TIME [id number]    Blood Product Unit Number [id number]    PRODUCT CODE [id number]    Unit Type and Rh 6200    Blood Product Expiration Date [id number]    ISSUE DATE / TIME [id number]    Blood Product Unit Number [id number]    PRODUCT CODE [id number]    Unit Type and Rh 0600    Blood Product Expiration Date [id number]    ISSUE DATE / TIME [id number]    Blood Product Unit Number [id number]    PRODUCT CODE E0382V00    Unit Type and Rh 6200    Blood Product Expiration Date [id number]   Ferritin   Collection Time: 08/21/18  4:26 PM  Result Value Ref Range   Ferritin 3 (L) 11 - 307 ng/mL  Iron and TIBC   Collection Time: 08/21/18  4:26 PM  Result Value Ref Range   Iron 19 (L) 28 - 170 ug/dL   TIBC 017543 (H) 510250 - 258450 ug/dL  Saturation Ratios 4 (L) 10.4 - 31.8 %   UIBC 524 ug/dL  Transferrin   Collection Time: 08/21/18  4:26 PM  Result Value Ref Range   Transferrin 380 192 - 382 mg/dL  Vitamin Z61B12   Collection Time: 08/21/18  4:26 PM  Result Value Ref Range   Vitamin B-12 108 (L) 180 - 914 pg/mL  Prepare RBC   Collection Time: 08/21/18  5:26 PM  Result Value Ref Range   Order Confirmation      ORDER PROCESSED BY BLOOD BANK Performed at [facility], [address] [place], [place] [address]   CBC   Collection Time: 08/22/18  3:59 AM  Result Value Ref Range   WBC 7.5 4.0 - 10.5 K/uL   RBC 3.54 (L) 3.87 - 5.11 MIL/uL   Hemoglobin 7.5 (L) 12.0 - 15.0 g/dL   HCT 54.026.1 (L) 98.136.0 - 19.146.0 %   MCV 73.7 (L) 80.0 - 100.0 fL   MCH 21.2 (L) 26.0 - 34.0 pg   MCHC 28.7 (L) 30.0 - 36.0 g/dL   RDW 47.819.5 (H) 29.511.5 - 62.115.5 %   Platelets 204 150 - 400 K/uL   nRBC 0.0 0.0 - 0.2 %  Prepare RBC   Collection Time: 08/22/18  7:35 AM  Result Value Ref Range   Order Confirmation      ORDER PROCESSED BY BLOOD BANK Performed at [facility], [address] [place], [place] [address]   Hemoglobin   Collection Time: 08/22/18  5:45 PM  Result Value Ref Range   Hemoglobin 9.1 (L) 12.0 - 15.0 g/dL    Treatments: IV hydration, blood transfusion, valtrex, flagyl, iron, vitamin b12.  Hospital Course:  This is a 23 y.o. [id number] with IUP at 1109w5d admitted for anemia in pregnancy near term.  She was also found to have a genital herpes outbreak, bacterial vaginosis, vitamin b12 deficiency, iron deficiency. She was noted to have a cervical exam of 1.5/long/high.  No leaking of fluid and no bleeding.  She was given 3 units of PRBCs with rise from 8.1 to 9.1.  She was given zofran, iron and vitamin b12, valtrex, and flagyl for treatments. Her nausea and vomiting improved.  She had no signs/symptoms of preterm labor or other maternal-fetal concerns.  She was deemed stable for discharge to home with outpatient follow up  with OB, Maternal-Fetal Medicine, and Hematology.  Discharge Physical Exam:  BP 117/67   Pulse 76   Temp 97.9 F (36.6 C) (Oral)   Resp 16   Ht 5\' 3"  (1.6 m)   SpO2 99%   BMI 24.80 kg/m   General: NAD CV: RRR Pulm: CTABL, nl effort ABD: s/nd/nt, gravid DVT Evaluation: LE non-ttp, no evidence of DVT on exam.  Baseline: 135 Variability: moderate Accelerations present x >2 Decelerations absent Time 20mins Interpretation: reactive NST, category 1 tracing  Discharge Condition: Stable  Disposition:  discharge home with meds and close follow up.  Discussed importance of remaining on valtrex the remainder of her pregnancy and if suspicious herpetic lesions are present at time of delivery she may have to be delivered by cesarean.   Allergies as of 08/22/2018   No Known Allergies     Medication List    STOP taking these medications   metoCLOPramide 10 MG tablet Commonly known as:  REGLAN     TAKE these medications   docusate sodium 100 MG capsule Commonly known as:  COLACE Take 1 capsule (100 mg total) by mouth 2 (two)  times daily.   ferrous sulfate 325 (65 FE) MG tablet Take 1 tablet (325 mg total) by mouth 2 (two) times daily with a meal.   metroNIDAZOLE 500 MG tablet Commonly known as:  FLAGYL Take 1 tablet (500 mg total) by mouth every 12 (twelve) hours for 6 days.   nicotine polacrilex 2 MG gum Commonly known as:  NICORETTE Take 1 each (2 mg total) by mouth as needed for smoking cessation.   ondansetron 4 MG disintegrating tablet Commonly known as:  ZOFRAN-ODT Take 1 tablet (4 mg total) by mouth every 6 (six) hours as needed for nausea or vomiting.   prenatal vitamin w/FE, FA 29-1 MG Chew chewable tablet Chew 1 tablet by mouth daily at 12 noon.   sertraline 50 MG tablet Commonly known as:  ZOLOFT Take 1 tablet (50 mg total) by mouth daily.   valACYclovir 500 MG tablet Commonly known as:  VALTREX Take 1 tablet (500 mg total) by mouth 2 (two) times  daily.     ASK your doctor about these medications   cyanocobalamin 1000 MCG/ML injection Commonly known as:  (VITAMIN B-12) Inject 1 mL (1,000 mcg total) into the muscle once for 1 dose. Ask about: Should I take this medication?      Follow-up Information    Jeralyn Ruths, MD Follow up in 1 week(s).   Specialty:  Oncology Contact information: [address] [place] [address] [phone]        [facility] Follow up in 1 week(s).   Specialty:  Perinatology Contact information: [address] [place] [address] [phone]          Signed: ----- Ranae Plumber, MD Attending Obstetrician and [facility]

## 2018-09-21 ENCOUNTER — Inpatient Hospital Stay: Payer: Medicaid Other | Admitting: Anesthesiology

## 2018-09-21 ENCOUNTER — Inpatient Hospital Stay
Admission: EM | Admit: 2018-09-21 | Discharge: 2018-09-23 | DRG: 806 | Disposition: A | Discharge status: Home / Self Care | Payer: Medicaid Other | Attending: Obstetrics and Gynecology | Admitting: Obstetrics and Gynecology

## 2018-09-21 ENCOUNTER — Other Ambulatory Visit: Payer: Self-pay

## 2018-09-21 DIAGNOSIS — F17210 Nicotine dependence, cigarettes, uncomplicated: Secondary | ICD-10-CM | POA: Diagnosis present

## 2018-09-21 DIAGNOSIS — O9081 Anemia of the puerperium: Secondary | ICD-10-CM | POA: Diagnosis not present

## 2018-09-21 DIAGNOSIS — Z23 Encounter for immunization: Secondary | ICD-10-CM

## 2018-09-21 DIAGNOSIS — A6000 Herpesviral infection of urogenital system, unspecified: Secondary | ICD-10-CM | POA: Diagnosis present

## 2018-09-21 DIAGNOSIS — O9832 Other infections with a predominantly sexual mode of transmission complicating childbirth: Secondary | ICD-10-CM | POA: Diagnosis present

## 2018-09-21 DIAGNOSIS — O99334 Smoking (tobacco) complicating childbirth: Secondary | ICD-10-CM | POA: Diagnosis present

## 2018-09-21 DIAGNOSIS — Z3A40 40 weeks gestation of pregnancy: Secondary | ICD-10-CM | POA: Diagnosis not present

## 2018-09-21 DIAGNOSIS — O368390 Maternal care for abnormalities of the fetal heart rate or rhythm, unspecified trimester, not applicable or unspecified: Secondary | ICD-10-CM | POA: Diagnosis present

## 2018-09-21 DIAGNOSIS — F1290 Cannabis use, unspecified, uncomplicated: Secondary | ICD-10-CM | POA: Diagnosis present

## 2018-09-21 DIAGNOSIS — D62 Acute posthemorrhagic anemia: Secondary | ICD-10-CM | POA: Diagnosis not present

## 2018-09-21 DIAGNOSIS — O99324 Drug use complicating childbirth: Secondary | ICD-10-CM | POA: Diagnosis present

## 2018-09-21 HISTORY — DX: Anemia, unspecified: D64.9

## 2018-09-21 LAB — URINE DRUG SCREEN, QUALITATIVE (ARMC ONLY)
Amphetamines, Ur Screen: NOT DETECTED
Barbiturates, Ur Screen: NOT DETECTED
Benzodiazepine, Ur Scrn: NOT DETECTED
Cannabinoid 50 Ng, Ur ~~LOC~~: POSITIVE — AB
Cocaine Metabolite,Ur ~~LOC~~: NOT DETECTED
MDMA (Ecstasy)Ur Screen: NOT DETECTED
Methadone Scn, Ur: NOT DETECTED
Opiate, Ur Screen: NOT DETECTED
Phencyclidine (PCP) Ur S: NOT DETECTED
Tricyclic, Ur Screen: NOT DETECTED

## 2018-09-21 LAB — CBC
HCT: 35.5 % — ABNORMAL LOW (ref 36.0–46.0)
Hemoglobin: 11.1 g/dL — ABNORMAL LOW (ref 12.0–15.0)
MCH: 24 pg — ABNORMAL LOW (ref 26.0–34.0)
MCHC: 31.3 g/dL (ref 30.0–36.0)
MCV: 76.8 fL — ABNORMAL LOW (ref 80.0–100.0)
Platelets: 177 10*3/uL (ref 150–400)
RBC: 4.62 MIL/uL (ref 3.87–5.11)
RDW: 25.1 % — ABNORMAL HIGH (ref 11.5–15.5)
WBC: 10.2 10*3/uL (ref 4.0–10.5)
nRBC: 0 % (ref 0.0–0.2)

## 2018-09-21 LAB — TYPE AND SCREEN
ABO/RH(D): AB POS
Antibody Screen: NEGATIVE

## 2018-09-21 LAB — GROUP B STREP BY PCR: GROUP B STREP BY PCR: NEGATIVE

## 2018-09-21 LAB — RAPID HIV SCREEN (HIV 1/2 AB+AG)
HIV 1/2 ANTIBODIES: NONREACTIVE
HIV-1 P24 Antigen - HIV24: NONREACTIVE

## 2018-09-21 LAB — RUPTURE OF MEMBRANE (ROM)PLUS: Rom Plus: NEGATIVE

## 2018-09-21 MED ORDER — DIPHENHYDRAMINE HCL 50 MG/ML IJ SOLN: 12.5000 mg | INTRAMUSCULAR | Status: DC | PRN

## 2018-09-21 MED ORDER — LACTATED RINGERS IV SOLN: 500.0000 mL | INTRAVENOUS | Status: DC | PRN

## 2018-09-21 MED ORDER — LIDOCAINE HCL (PF) 1 % IJ SOLN
INTRAMUSCULAR | Status: AC
  Filled 2018-09-21: qty 30

## 2018-09-21 MED ORDER — LACTATED RINGERS IV SOLN: 500.0000 mL | Freq: Once | INTRAVENOUS | Status: DC

## 2018-09-21 MED ORDER — ACETAMINOPHEN 325 MG PO TABS: 650.0000 mg | ORAL_TABLET | ORAL | Status: DC | PRN

## 2018-09-21 MED ORDER — ONDANSETRON HCL 4 MG/2ML IJ SOLN: 4.0000 mg | Freq: Four times a day (QID) | INTRAMUSCULAR | Status: DC | PRN

## 2018-09-21 MED ORDER — BUTORPHANOL TARTRATE 2 MG/ML IJ SOLN: 1.0000 mg | INTRAMUSCULAR | Status: DC | PRN

## 2018-09-21 MED ORDER — AMMONIA AROMATIC IN INHA
RESPIRATORY_TRACT | Status: AC
  Filled 2018-09-21: qty 10

## 2018-09-21 MED ORDER — TERBUTALINE SULFATE 1 MG/ML IJ SOLN: 0.2500 mg | Freq: Once | INTRAMUSCULAR | Status: DC | PRN

## 2018-09-21 MED ORDER — MISOPROSTOL 200 MCG PO TABS
ORAL_TABLET | ORAL | Status: AC
  Filled 2018-09-21: qty 4

## 2018-09-21 MED ORDER — LACTATED RINGERS IV SOLN
INTRAVENOUS | Status: DC
  Administered 2018-09-21 (×2): via INTRAVENOUS

## 2018-09-21 MED ORDER — OXYTOCIN BOLUS FROM INFUSION
500.0000 mL | Freq: Once | INTRAVENOUS | Status: AC
  Administered 2018-09-21: 500 mL via INTRAVENOUS

## 2018-09-21 MED ORDER — DIPHENHYDRAMINE HCL 25 MG PO CAPS: 25.0000 mg | ORAL_CAPSULE | Freq: Four times a day (QID) | ORAL | Status: DC | PRN

## 2018-09-21 MED ORDER — SODIUM CHLORIDE 0.9 % IV SOLN
INTRAVENOUS | Status: DC | PRN
  Administered 2018-09-21 (×3): 5 mL via EPIDURAL

## 2018-09-21 MED ORDER — IBUPROFEN 600 MG PO TABS: 600.0000 mg | ORAL_TABLET | Freq: Four times a day (QID) | ORAL | Status: DC

## 2018-09-21 MED ORDER — LIDOCAINE HCL (PF) 1 % IJ SOLN: 30.0000 mL | INTRAMUSCULAR | Status: DC | PRN

## 2018-09-21 MED ORDER — EPHEDRINE 5 MG/ML INJ: 10.0000 mg | INTRAVENOUS | Status: DC | PRN

## 2018-09-21 MED ORDER — SOD CITRATE-CITRIC ACID 500-334 MG/5ML PO SOLN: 30.0000 mL | ORAL | Status: DC | PRN

## 2018-09-21 MED ORDER — VALACYCLOVIR HCL 500 MG PO TABS
500.0000 mg | ORAL_TABLET | Freq: Two times a day (BID) | ORAL | Status: DC
  Administered 2018-09-22: 500 mg via ORAL
  Filled 2018-09-21 (×2): qty 1

## 2018-09-21 MED ORDER — OXYTOCIN 40 UNITS IN NORMAL SALINE INFUSION - SIMPLE MED
2.5000 [IU]/h | INTRAVENOUS | Status: DC
  Administered 2018-09-21: 2.5 [IU]/h via INTRAVENOUS
  Filled 2018-09-21: qty 1000

## 2018-09-21 MED ORDER — PHENYLEPHRINE 40 MCG/ML (10ML) SYRINGE FOR IV PUSH (FOR BLOOD PRESSURE SUPPORT): 80.0000 ug | PREFILLED_SYRINGE | INTRAVENOUS | Status: DC | PRN

## 2018-09-21 MED ORDER — OXYTOCIN 10 UNIT/ML IJ SOLN
INTRAMUSCULAR | Status: AC
  Filled 2018-09-21: qty 2

## 2018-09-21 MED ORDER — LIDOCAINE HCL (PF) 1 % IJ SOLN
INTRAMUSCULAR | Status: DC | PRN
  Administered 2018-09-21: 1 mL

## 2018-09-21 MED ORDER — FENTANYL 2.5 MCG/ML W/ROPIVACAINE 0.15% IN NS 100 ML EPIDURAL (ARMC)
12.0000 mL/h | EPIDURAL | Status: DC
  Administered 2018-09-21: 12 mL/h via EPIDURAL

## 2018-09-21 MED ORDER — SODIUM CHLORIDE 0.9 % IV SOLN: 2.0000 g | Freq: Once | INTRAVENOUS | Status: DC | PRN

## 2018-09-21 MED ORDER — ONDANSETRON HCL 4 MG/2ML IJ SOLN: 4.0000 mg | INTRAMUSCULAR | Status: DC | PRN

## 2018-09-21 MED ORDER — INFLUENZA VAC SPLIT QUAD 0.5 ML IM SUSY
0.5000 mL | PREFILLED_SYRINGE | INTRAMUSCULAR | Status: DC
  Filled 2018-09-21: qty 0.5

## 2018-09-21 MED ORDER — MISOPROSTOL 25 MCG QUARTER TABLET
25.0000 ug | ORAL_TABLET | ORAL | Status: DC | PRN
  Filled 2018-09-21: qty 1

## 2018-09-21 MED ORDER — FENTANYL 2.5 MCG/ML W/ROPIVACAINE 0.15% IN NS 100 ML EPIDURAL (ARMC)
EPIDURAL | Status: AC
  Filled 2018-09-21: qty 100

## 2018-09-21 MED ORDER — OXYTOCIN 40 UNITS IN NORMAL SALINE INFUSION - SIMPLE MED
1.0000 m[IU]/min | INTRAVENOUS | Status: DC
  Administered 2018-09-21: 1 m[IU]/min via INTRAVENOUS

## 2018-09-21 MED ORDER — ONDANSETRON HCL 4 MG PO TABS: 4.0000 mg | ORAL_TABLET | ORAL | Status: DC | PRN
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
## 2018-09-21 NOTE — Anesthesia Procedure Notes (Signed)
Epidural Patient location during procedure: OB Start time: 09/21/2018 9:50 PM End time: 09/21/2018 10:03 PM  Staffing Anesthesiologist: Jovita Gamma, MD Performed: anesthesiologist   Preanesthetic Checklist Completed: patient identified, site marked, surgical consent, pre-op evaluation, timeout performed, IV checked, risks and benefits discussed and monitors and equipment checked  Epidural Patient position: sitting Prep: ChloraPrep Patient monitoring: heart rate, continuous pulse ox and blood pressure Approach: midline Location: L4-L5 Injection technique: LOR saline  Needle:  Needle type: Tuohy  Needle gauge: 18 G Needle length: 9 cm and 9 Needle insertion depth: 6 cm Catheter type: closed end flexible Catheter size: 20 Guage Catheter at skin depth: 10 cm Test dose: negative and Other  Assessment Events: blood not aspirated, injection not painful, no injection resistance, negative IV test and no paresthesia  Additional Notes   Patient tolerated the insertion well without complications.Reason for block:procedure for pain

## 2018-09-21 NOTE — H&P (Addendum)
OB ADMISSION/ HISTORY & PHYSICAL:  Admission Date: 09/21/2018  3:41 PM  Admit Diagnosis: Fetal decels in triage  Audrey Beard is a 23 y.o. female presenting for iol.  Prenatal History: [id number]   EDC : 09/21/2018, Date entered prior to episode creation  P[facility]atal care at [facility], but no records on the chart Per patient, prenatal course complicated by:  1. Limited prenatal care- no records available; dating per [place] at [redacted]w[redacted]d in ER 2. Hx HSV, prescribed valtrex but pt not sure when she took it last 3. Close interconceptual spacing, last SVD 10/24/2017  4. Hx anemia in this pregnancy, has received transfusion during this pregnancy for same 5. Hx of PreE with first pregnancy, might have been induced. No evidence of HTN in this pregnancy 6. Depression: prescribed zoloft, but patient not taking 7. THC in urine as recently as 08/21/2018  Medical / Surgical History :  Past medical history:  Past Medical History:  Diagnosis Date  . Anemia   . Depression   . Pregnancy induced hypertension      Past surgical history:  Past Surgical History:  Procedure Laterality Date  . NO PAST SURGERIES      Family History: History reviewed. No pertinent family history.   Social History:  reports that she has been smoking cigarettes. She has a 1.00 pack-year smoking history. She has never used smokeless tobacco. She reports current drug use. Drug: Marijuana. She reports that she does not drink alcohol.   Allergies: Patient has no known allergies.    Current Medications at time of admission:  Prior to Admission medications   Medication Sig Start Date End Date Taking? Authorizing Provider  ferrous sulfate 325 (65 FE) MG tablet Take 1 tablet (325 mg total) by mouth 2 (two) times daily with a meal. 10/26/17  Yes Sharee Pimple, CNM  prenatal vitamin w/FE, FA (NATACHEW) 29-1 MG CHEW chewable tablet Chew 1 tablet by mouth daily at 12 noon. 03/09/18  Yes Evon Slack, PA-C  docusate sodium (COLACE)  100 MG capsule Take 1 capsule (100 mg total) by mouth 2 (two) times daily. Patient not taking: Reported on 09/21/2018 08/22/18   Ward, Elenora Fender, MD  nicotine polacrilex (NICORETTE) 2 MG gum Take 1 each (2 mg total) by mouth as needed for smoking cessation. Patient not taking: Reported on 09/21/2018 08/22/18   Ward, Elenora Fender, MD  ondansetron (ZOFRAN-ODT) 4 MG disintegrating tablet Take 1 tablet (4 mg total) by mouth every 6 (six) hours as needed for nausea or vomiting. Patient not taking: Reported on 09/21/2018 08/22/18   Ward, Elenora Fender, MD  sertraline (ZOLOFT) 50 MG tablet Take 1 tablet (50 mg total) by mouth daily. Patient not taking: Reported on 09/21/2018 09/24/17   Clapacs, Jackquline Denmark, MD  valACYclovir (VALTREX) 500 MG tablet Take 1 tablet (500 mg total) by mouth 2 (two) times daily. 08/22/18   Ward, Elenora Fender, MD     Review of Systems: Active FM bloody show started 7pm   Physical Exam:  VS: Blood pressure 121/67, pulse 65, temperature 98 F (36.7 C), resp. rate 17, height 5\' 3"  (1.6 m), last menstrual period 12/15/2017, unknown if currently breastfeeding.  General: alert and oriented, appears anxious Heart: RRR Lungs: Clear lung fields Abdomen: Gravid, soft and non-tender, non-distended / uterus: non tender Extremities: no edema  FHT: 135, moderate variability, +accels, +late and variable rare decels TOCO: rare SVE:  Dilation: Fingertip / Effacement (%): Thick / Station: -3  Sterile speculum exam negative  for HSV lesions on cervix, vaginal mucosa or vulva.  Cephalic by leopolds  Prenatal Labs: Blood type/Rh --/--/AB POS (01/19 1618)  Antibody screen neg  Rubella Immune  Varicella Immune  RPR NR  HBsAg Neg  HIV NR  GC Neg 1 month ago  Chlamydia neg  Genetic screening negative  1 hour GTT unknown  3 hour GTT n/a  GBS unknown   No results found.  Assessment: [redacted] weeks gestation, dating by 12 wks scan in ER FHR category 2  Plan:  Admit for induction of labor Labs  pending Epidural when desired Continuous fetal monitoring  1. GBS collected, rapid PCR pending 2. 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 Physical exam negative 3. Unknown glucola: blood glucose pending 4. Rh positive 5. Plan for induction with [facility] and pitocin. Pelvis proven to 7#4oz. 6. Discussion with patient and partner re: induction and indications   7. Fetal Well being  - Fetal Tracing: 2 - Group B Streptococcus: pending - Presentation: vtx confirmed by Leopolds    4. Post Partum Planning: - Infant feeding: bottle - Contraception: Depo

## 2018-09-21 NOTE — OB Triage Note (Addendum)
Pt is a 22yo G4P3 at [redacted]w[redacted]d that presents from the ED c/o LOF that happened today around 1300. Pt states the fluid was clear and only lasted for a minute but no pad was needed and pt denies any other leaking fluid. Patient denied ctx at first but when a second nurse went into the room pt states that ctx are frequent and painful.. Pt states last intercourse 09/20/18. Pt denies VB, and states positive FM. Initial fht 155 with monitors applied and assessing.

## 2018-09-21 NOTE — Anesthesia Preprocedure Evaluation (Signed)
Anesthesia Evaluation  Patient identified by MRN, date of birth, ID band Patient awake    Reviewed: Allergy & Precautions, H&P , NPO status , Patient's Chart, lab work & pertinent test results  Airway Mallampati: III  TM Distance: >3 FB     Dental  (+) Teeth Intact   Pulmonary neg pulmonary ROS, Current Smoker,           Cardiovascular Exercise Tolerance: Good   Pre-ecclampsia with previous pregnancy.  No issues with current pregnancy   Neuro/Psych PSYCHIATRIC DISORDERS Depression    GI/Hepatic negative GI ROS, (+)     substance abuse  marijuana use,   Endo/Other    Renal/GU   negative genitourinary   Musculoskeletal   Abdominal   Peds  Hematology  (+) Blood dyscrasia, anemia ,   Anesthesia Other Findings Past Medical History: No date: Anemia No date: Depression No date: Pregnancy induced hypertension  Past Surgical History: No date: NO PAST SURGERIES  BMI    Body Mass Index:  30.11 kg/m      Reproductive/Obstetrics (+) Pregnancy                             Anesthesia Physical Anesthesia Plan  ASA: II  Anesthesia Plan: Epidural   Post-op Pain Management:    Induction:   PONV Risk Score and Plan:   Airway Management Planned:   Additional Equipment:   Intra-op Plan:   Post-operative Plan:   Informed Consent: I have reviewed the patients History and Physical, chart, labs and discussed the procedure including the risks, benefits and alternatives for the proposed anesthesia with the patient or authorized representative who has indicated his/her understanding and acceptance.       Plan Discussed with: Anesthesiologist  Anesthesia Plan Comments:         Anesthesia Quick Evaluation

## 2018-09-21 NOTE — Progress Notes (Signed)
Cook cath expelled. Pitocin at 32mu/min. Pt c/o contraction pain more strongly. Requests epidural.

## 2018-09-21 NOTE — H&P (Addendum)
Progress note:  At bedside. Decision to proceed with cook cath/low dose pitocin and not cytotec because of the deep variables with occasional contractions.   Dilation: 3 Effacement (%): 50 Cervical Position: Middle Station: -3 Exam by:: Dalbert Garnet MD  Adriana Simas placed. Consider AROM and IUPC/infusion when clinical stable. Pt tolerated well.

## 2018-09-22 LAB — CBC
HCT: 33.2 % — ABNORMAL LOW (ref 36.0–46.0)
HEMOGLOBIN: 10.3 g/dL — AB (ref 12.0–15.0)
MCH: 23.9 pg — ABNORMAL LOW (ref 26.0–34.0)
MCHC: 31 g/dL (ref 30.0–36.0)
MCV: 77 fL — ABNORMAL LOW (ref 80.0–100.0)
Platelets: 150 10*3/uL (ref 150–400)
RBC: 4.31 MIL/uL (ref 3.87–5.11)
RDW: 24.8 % — ABNORMAL HIGH (ref 11.5–15.5)
WBC: 13.2 10*3/uL — ABNORMAL HIGH (ref 4.0–10.5)
nRBC: 0 % (ref 0.0–0.2)

## 2018-09-22 MED ORDER — BISACODYL 10 MG RE SUPP: 10.0000 mg | Freq: Every day | RECTAL | Status: DC | PRN

## 2018-09-22 MED ORDER — MEASLES, MUMPS & RUBELLA VAC IJ SOLR: 0.5000 mL | Freq: Once | INTRAMUSCULAR | Status: DC

## 2018-09-22 MED ORDER — COCONUT OIL OIL: 1.0000 "application " | TOPICAL_OIL | Status: DC | PRN

## 2018-09-22 MED ORDER — SODIUM CHLORIDE 0.9% FLUSH: 3.0000 mL | INTRAVENOUS | Status: DC | PRN

## 2018-09-22 MED ORDER — ONDANSETRON HCL 4 MG PO TABS: 4.0000 mg | ORAL_TABLET | ORAL | Status: DC | PRN

## 2018-09-22 MED ORDER — BENZOCAINE-MENTHOL 20-0.5 % EX AERO: 1.0000 "application " | INHALATION_SPRAY | CUTANEOUS | Status: DC | PRN

## 2018-09-22 MED ORDER — FLEET ENEMA 7-19 GM/118ML RE ENEM: 1.0000 | ENEMA | Freq: Every day | RECTAL | Status: DC | PRN

## 2018-09-22 MED ORDER — PRENATAL MULTIVITAMIN CH
1.0000 | ORAL_TABLET | Freq: Every day | ORAL | Status: DC
  Administered 2018-09-22 – 2018-09-23 (×2): 1 via ORAL
  Filled 2018-09-22 (×2): qty 1

## 2018-09-22 MED ORDER — SODIUM CHLORIDE 0.9 % IV SOLN: 250.0000 mL | INTRAVENOUS | Status: DC | PRN

## 2018-09-22 MED ORDER — SIMETHICONE 80 MG PO CHEW: 80.0000 mg | CHEWABLE_TABLET | ORAL | Status: DC | PRN

## 2018-09-22 MED ORDER — MEDROXYPROGESTERONE ACETATE 150 MG/ML IM SUSP
150.0000 mg | INTRAMUSCULAR | Status: AC | PRN
  Administered 2018-09-23: 150 mg via INTRAMUSCULAR
  Filled 2018-09-22: qty 1

## 2018-09-22 MED ORDER — WITCH HAZEL-GLYCERIN EX PADS: 1.0000 "application " | MEDICATED_PAD | CUTANEOUS | Status: DC | PRN

## 2018-09-22 MED ORDER — OXYCODONE HCL 5 MG PO TABS: 5.0000 mg | ORAL_TABLET | ORAL | Status: DC | PRN

## 2018-09-22 MED ORDER — ONDANSETRON HCL 4 MG/2ML IJ SOLN: 4.0000 mg | INTRAMUSCULAR | Status: DC | PRN

## 2018-09-22 MED ORDER — PRENATAL MULTIVITAMIN CH: 1.0000 | ORAL_TABLET | Freq: Every day | ORAL | Status: DC

## 2018-09-22 MED ORDER — SODIUM CHLORIDE 0.9% FLUSH: 3.0000 mL | Freq: Two times a day (BID) | INTRAVENOUS | Status: DC

## 2018-09-22 MED ORDER — SENNOSIDES-DOCUSATE SODIUM 8.6-50 MG PO TABS
2.0000 | ORAL_TABLET | ORAL | Status: DC
  Filled 2018-09-22: qty 2

## 2018-09-22 MED ORDER — DIBUCAINE 1 % RE OINT: 1.0000 "application " | TOPICAL_OINTMENT | RECTAL | Status: DC | PRN

## 2018-09-22 MED ORDER — ZOLPIDEM TARTRATE 5 MG PO TABS: 5.0000 mg | ORAL_TABLET | Freq: Every evening | ORAL | Status: DC | PRN

## 2018-09-22 MED ORDER — IBUPROFEN 600 MG PO TABS
600.0000 mg | ORAL_TABLET | Freq: Four times a day (QID) | ORAL | Status: DC
  Administered 2018-09-22 – 2018-09-23 (×5): 600 mg via ORAL
  Filled 2018-09-22 (×6): qty 1

## 2018-09-22 MED ORDER — TETANUS-DIPHTH-ACELL PERTUSSIS 5-2.5-18.5 LF-MCG/0.5 IM SUSP: 0.5000 mL | Freq: Once | INTRAMUSCULAR | Status: DC

## 2018-09-22 MED ORDER — ACETAMINOPHEN 325 MG PO TABS
650.0000 mg | ORAL_TABLET | ORAL | Status: DC | PRN
  Administered 2018-09-22 (×3): 650 mg via ORAL
  Filled 2018-09-22 (×3): qty 2

## 2018-09-22 MED ORDER — SENNOSIDES-DOCUSATE SODIUM 8.6-50 MG PO TABS: 2.0000 | ORAL_TABLET | ORAL | Status: DC

## 2018-09-22 MED ORDER — INFLUENZA VAC SPLIT QUAD 0.5 ML IM SUSY
0.5000 mL | PREFILLED_SYRINGE | INTRAMUSCULAR | Status: AC
  Administered 2018-09-23: 0.5 mL via INTRAMUSCULAR
  Filled 2018-09-22: qty 0.5

## 2018-09-22 MED ORDER — DIPHENHYDRAMINE HCL 25 MG PO CAPS: 25.0000 mg | ORAL_CAPSULE | Freq: Four times a day (QID) | ORAL | Status: DC | PRN

## 2018-09-22 NOTE — Progress Notes (Signed)
This RN walked into room after assessment and patient was in the shower. Husband was helping her. Asked patient if she felt dizzy or lightheaded, she stated she was fine. Patient back in bed and fundus is firm, midline, UE, bleeding minimal. Will continue to monitor. Otilio Jefferson, RN

## 2018-09-22 NOTE — Anesthesia Postprocedure Evaluation (Signed)
Anesthesia Post Note  Patient: Audrey Beard  Procedure(s) Performed: AN AD HOC LABOR EPIDURAL  Patient location during evaluation: Mother Baby Anesthesia Type: Epidural Level of consciousness: oriented and awake and alert Pain management: pain level controlled Vital Signs Assessment: post-procedure vital signs reviewed and stable Respiratory status: spontaneous breathing and respiratory function stable Cardiovascular status: blood pressure returned to baseline and stable Postop Assessment: no headache, no backache, no apparent nausea or vomiting and able to ambulate Anesthetic complications: no     Last Vitals:  Vitals:   09/22/18 0422 09/22/18 0529  BP: (!) 108/50 104/61  Pulse: 72   Resp: 18   Temp: 36.8 C   SpO2: 97%     Last Pain:  Vitals:   09/22/18 0644  TempSrc:   PainSc: 0-No pain                 Starling Manns

## 2018-09-22 NOTE — Progress Notes (Signed)
Post Partum Day 1  Subjective: Doing well, no concerns. Ambulating without difficulty, pain managed with PO meds, tolerating regular diet, and voiding without difficulty.   No fever/chills, chest pain, shortness of breath, nausea/vomiting, or leg pain. No nipple or breast pain.   Objective: BP 133/87 (BP Location: Left Arm)   Pulse 74   Temp 98.5 F (36.9 C) (Oral)   Resp 18   Ht 5\' 3"  (1.6 m)   Wt 77.1 kg   LMP 12/15/2017   SpO2 94%   Breastfeeding Unknown   BMI 30.11 kg/m    Physical Exam:  General: alert, cooperative, appears stated age and flat affect Breasts: soft/nontender CV: RRR Pulm: nl effort, CTABL Abdomen: soft, non-tender, active bowel sounds Uterine Fundus: firm Lochia: appropriate DVT Evaluation: No evidence of DVT seen on physical exam. No cords or calf tenderness. No significant calf/ankle edema.  Recent Labs    09/21/18 1912 09/22/18 0541  HGB 11.1* 10.3*  HCT 35.5* 33.2*  WBC 10.2 13.2*  PLT 177 150    Assessment/Plan: 23 y.o. [id number] postpartum day # 1  -Continue routine postpartum care -Encouraged snug fitting bra and minimizing breast stimulation for formula feeding  -Acute blood loss anemia - hemodynamically stable and asymptomatic; continue prenatal vitamin and with stool softeners  -Immunization status: Needs Tdap and flu prior to discharge, ordered -Plan for discharge home tomorrow  Disposition: Continue inpatient postpartum care    LOS: 1 day   Genia Del, CNM 09/22/2018, 1:02 PM   ----- Genia Del Certified Nurse Midwife [facility]

## 2018-09-22 NOTE — Progress Notes (Signed)
Patient called RN into the room and asked if there was any way for people to not to know she was here, she stated she that she just didn't want to be bothered. RN asked her If she was okay, and she said yes. RN told her that she understood. We could list her as a NO in the directory that way people wouldn't know she was here. But if she does want visitors then she would just need for them to say her name and the password, if they did not say the password then the answer would be there is not a patient here by that name. The patient said yes, that is what she wanted. This was around 1330. Patient password put in place.   Around 1530 a girl named Verlee Monte with DSS stopped by to see the patient. RN questioned why and she stated there was already an open case for the other children and she had been working with them/following them, so this child would be added. She also stated that she was glad FOB was in the room because she had been trying to locate him for months. RN questioned why there was even a case open, and DSS stated they could not share that with me but to let her know if anything inappropriate happened and gave me her number. She asked if they had been caring for the infant appropriately and if they had been acting appropriately. RN stated yes, they have have been caring for infant appropriately. DSS also asked about mothers drug screen and RN informed them that mom was marijuana positive, but infant was negative per UDS.   CSW Beverly notified, she stated she would contact DSS worker but there is nothing more for RN to do at this point because there is already an open case, and she did not need to speak with her about anything.   Patient called RN back into room and asked why DSS worker was let back here when she had a password. RN stated that unfortunately DSS trumps the patient being a no in the directory. She verbalized understanding. She also stated that someone from the health department had  called and was demanding her room number and she did not tell them. She just did not want to bothered during her hospital stay. Monica, CSW aware of this as well.

## 2018-09-22 NOTE — Progress Notes (Signed)
   09/22/18 1400  Clinical Encounter Type  Visited With Patient and family together  Visit Type Initial  Referral From Chaplain  Consult/Referral To Chaplain  Chaplain was rounding and stop to visit the patient to check on her and baby. Patient seemed distant so Chaplain just congratulated her and ask is there anything she could do for her. Patient said no she was ok. Chaplain gave blessing and left.

## 2018-09-23 LAB — RPR: RPR Ser Ql: NONREACTIVE

## 2018-09-23 MED ORDER — IBUPROFEN 600 MG PO TABS: 600.0000 mg | ORAL_TABLET | Freq: Four times a day (QID) | ORAL | 0 refills | Status: DC

## 2018-09-23 MED ORDER — MEDROXYPROGESTERONE ACETATE 150 MG/ML IM SUSP
150.0000 mg | Freq: Once | INTRAMUSCULAR | Status: AC
  Administered 2018-09-23: 150 mg via INTRAMUSCULAR
  Filled 2018-09-23: qty 1

## 2018-09-23 NOTE — Clinical Social Work Note (Signed)
Patient known to CSW from previous admission on 1/20. On this admission, patient was positive for marijuana on admission. Patient's infant's urine drug screen was negative but cord tissue is pending. CSW spoke with Calton Dach at [employer] CPS this morning and she stated that patient is followed by their in home services and that the DSS caseworker came to visit with patient yesterday. DSS will continue to follow as an outpatient. If patient's newborn's cord tissue returns positive, CSW will make a DSS CPS report to notify the in home worker. York Spaniel MSW,LcSW [phone]

## 2018-09-23 NOTE — Progress Notes (Signed)
Dc inst reviewed with pt.  Verb u/o for home care and f/u visit.  DC to home via wc by staff auxillary.

## 2018-10-13 ENCOUNTER — Emergency Department: Payer: Medicaid Other

## 2018-10-13 ENCOUNTER — Emergency Department
Admission: EM | Admit: 2018-10-13 | Discharge: 2018-10-13 | Disposition: A | Discharge status: Home / Self Care | Payer: Medicaid Other | Attending: Emergency Medicine | Admitting: Emergency Medicine

## 2018-10-13 ENCOUNTER — Other Ambulatory Visit: Payer: Self-pay

## 2018-10-13 ENCOUNTER — Encounter: Payer: Self-pay | Admitting: Emergency Medicine

## 2018-10-13 DIAGNOSIS — Y9389 Activity, other specified: Secondary | ICD-10-CM | POA: Insufficient documentation

## 2018-10-13 DIAGNOSIS — Y9259 Other trade areas as the place of occurrence of the external cause: Secondary | ICD-10-CM | POA: Insufficient documentation

## 2018-10-13 DIAGNOSIS — T07XXXA Unspecified multiple injuries, initial encounter: Secondary | ICD-10-CM

## 2018-10-13 DIAGNOSIS — Y998 Other external cause status: Secondary | ICD-10-CM | POA: Insufficient documentation

## 2018-10-13 DIAGNOSIS — S0083XA Contusion of other part of head, initial encounter: Secondary | ICD-10-CM | POA: Diagnosis not present

## 2018-10-13 DIAGNOSIS — S0993XA Unspecified injury of face, initial encounter: Secondary | ICD-10-CM | POA: Diagnosis present

## 2018-10-13 DIAGNOSIS — F17210 Nicotine dependence, cigarettes, uncomplicated: Secondary | ICD-10-CM | POA: Insufficient documentation

## 2018-10-13 MED ORDER — CYCLOBENZAPRINE HCL 5 MG PO TABS: 5.0000 mg | ORAL_TABLET | Freq: Three times a day (TID) | ORAL | 0 refills | Status: DC | PRN

## 2018-10-13 MED ORDER — NAPROXEN 500 MG PO TABS: 500.0000 mg | ORAL_TABLET | Freq: Two times a day (BID) | ORAL | 0 refills | Status: AC

## 2018-10-13 MED ORDER — CYCLOBENZAPRINE HCL 10 MG PO TABS
10.0000 mg | ORAL_TABLET | Freq: Once | ORAL | Status: AC
  Administered 2018-10-13: 10 mg via ORAL
  Filled 2018-10-13: qty 1

## 2018-10-13 MED ORDER — NAPROXEN 500 MG PO TABS
500.0000 mg | ORAL_TABLET | Freq: Once | ORAL | Status: AC
  Administered 2018-10-13: 500 mg via ORAL
  Filled 2018-10-13: qty 1

## 2018-10-13 NOTE — ED Notes (Signed)
Pt care discussed with Dr. Scotty Court, [facility] for CT head, face, and maxillofacial, and DG T-Spine.

## 2018-10-13 NOTE — Discharge Instructions (Addendum)
Your exam, CTs, and x-rays do not reveal any fractures or brain injury. Take OTC Tylenol along with the prescription muscle relaxant. You should apply ice packs to any sore muscles or bruises. Follow-up with your provider for ongoing symptoms.
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
## 2019-02-28 IMAGING — US US OB COMP +14 WK
1 series · 13 of 28 positions shown · non-contrast
Comparison: none

CLINICAL DATA: Current assigned gestational age of 23 weeks 1 day.
Evaluate fetal anatomy.

EXAM:
OBSTETRICAL ULTRASOUND >14 WKS

[Series 1: us ob comp +14 wk · 70 acquisitions, 13 frames shown]
[im 3/70]
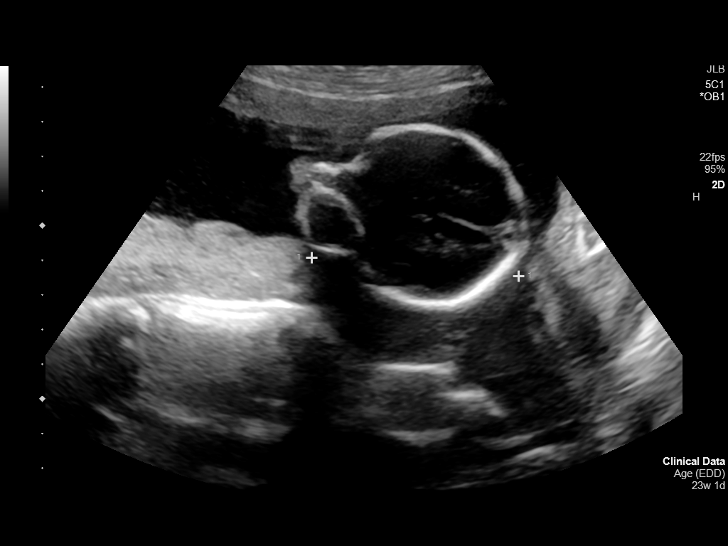
[im 8/70]
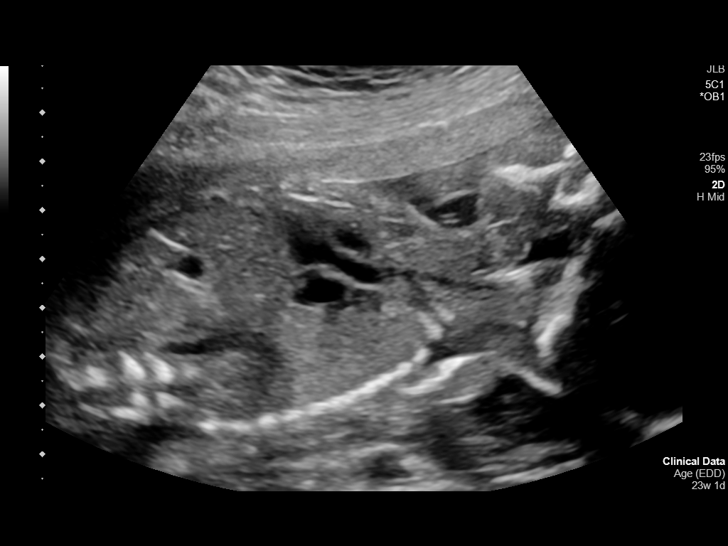
[im 13/70]
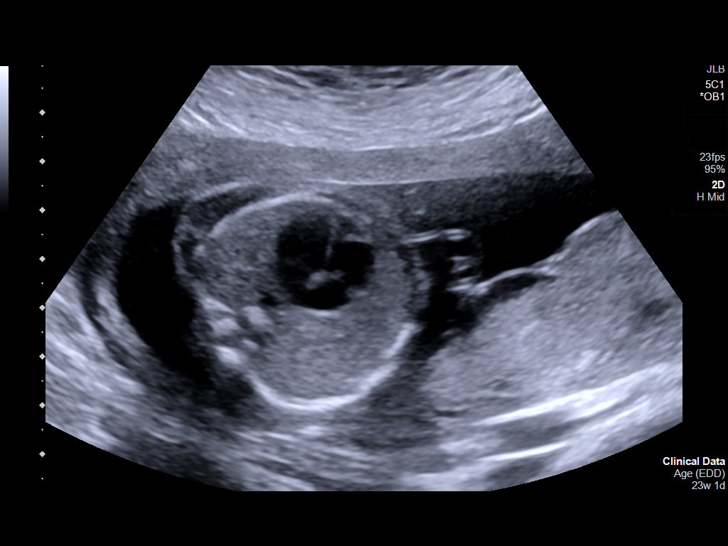
[im 18/70]
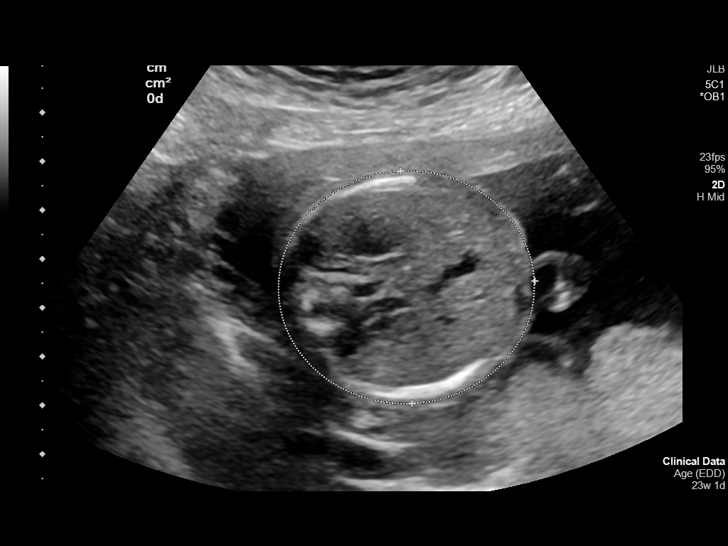
[im 24/70]
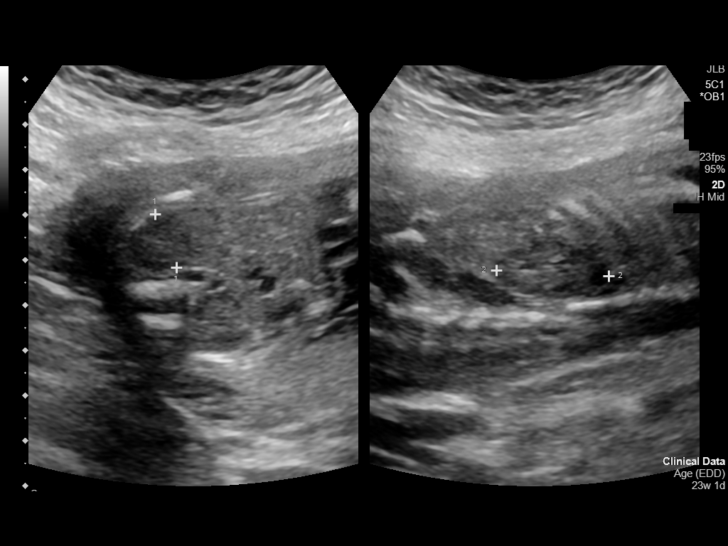
[im 29/70]
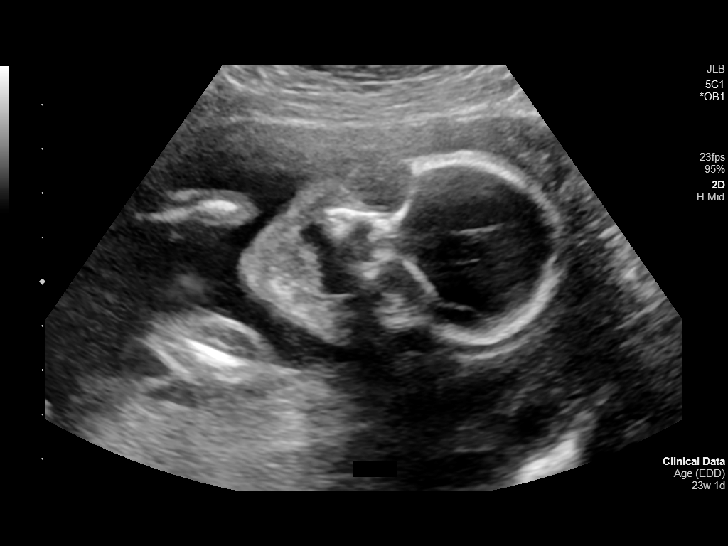
[im 36/70]
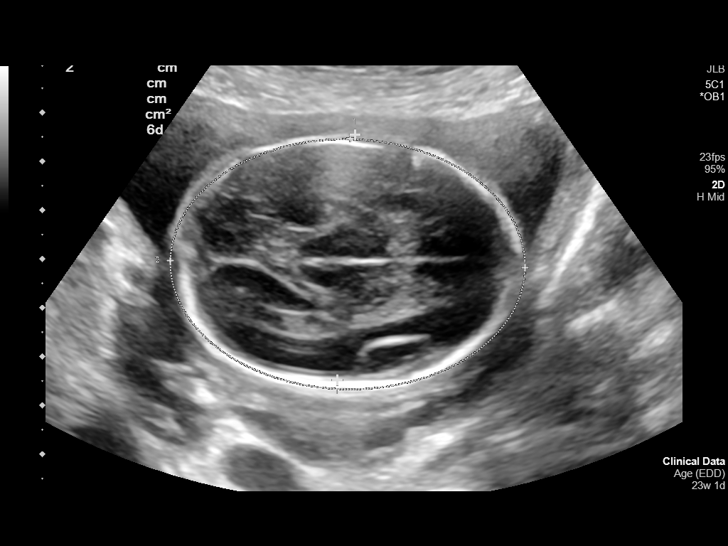
[im 41/70]
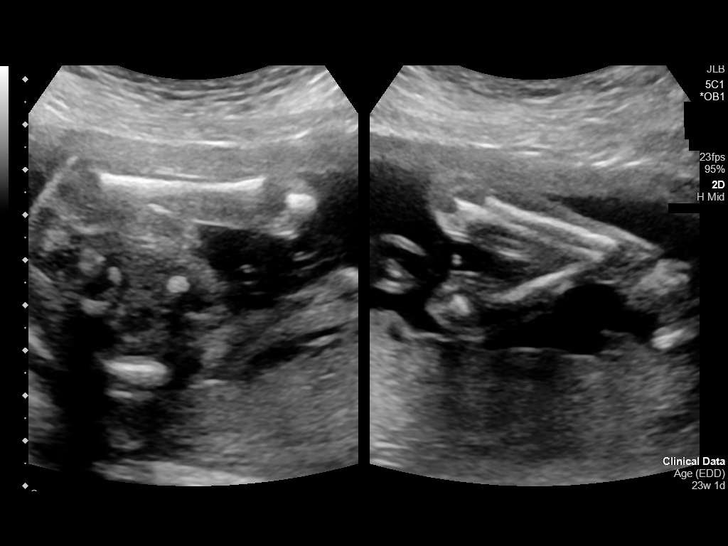
[im 47/70]
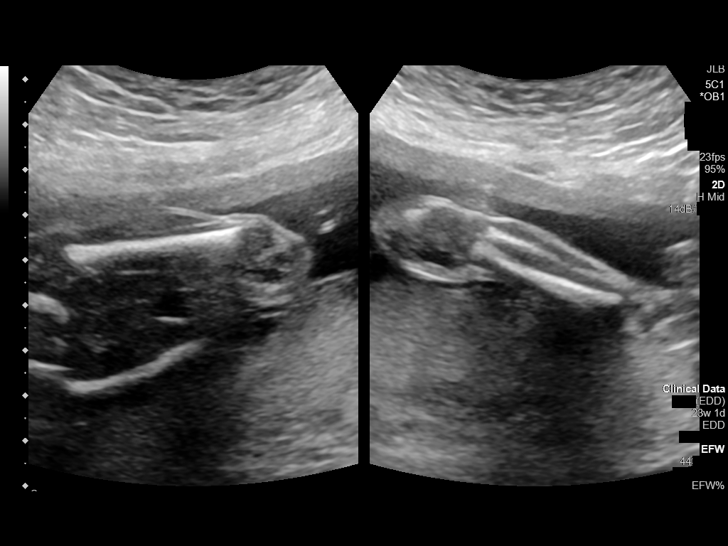
[im 52/70]
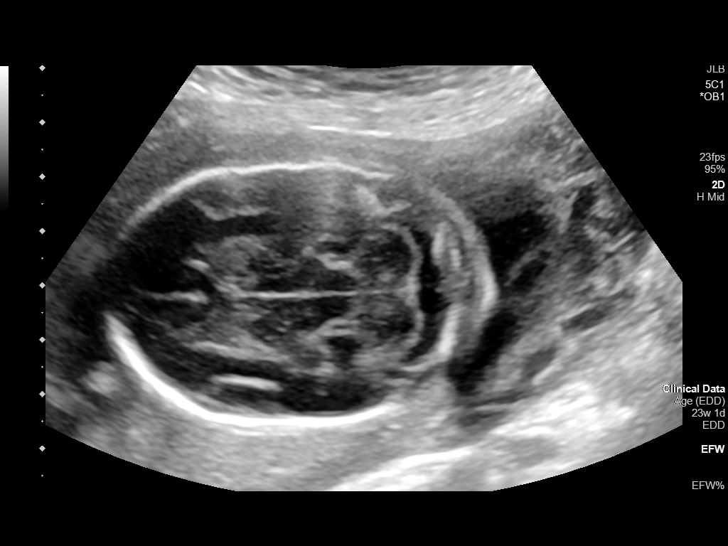
[im 57/70]
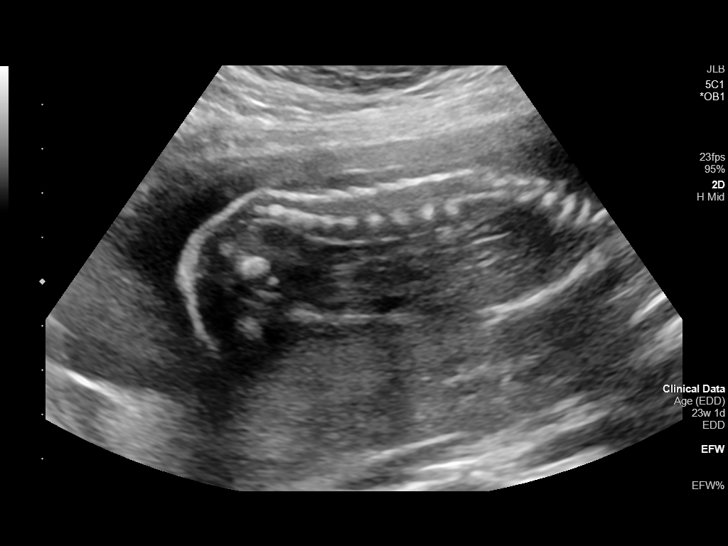
[im 62/70]
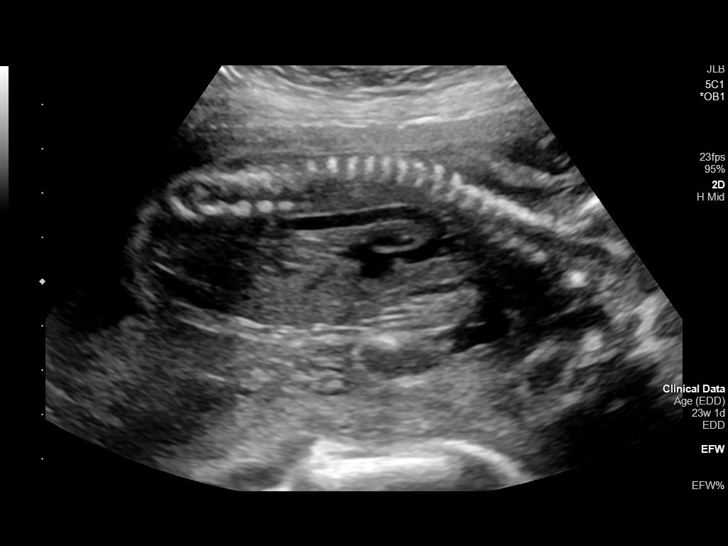
[im 67/70]
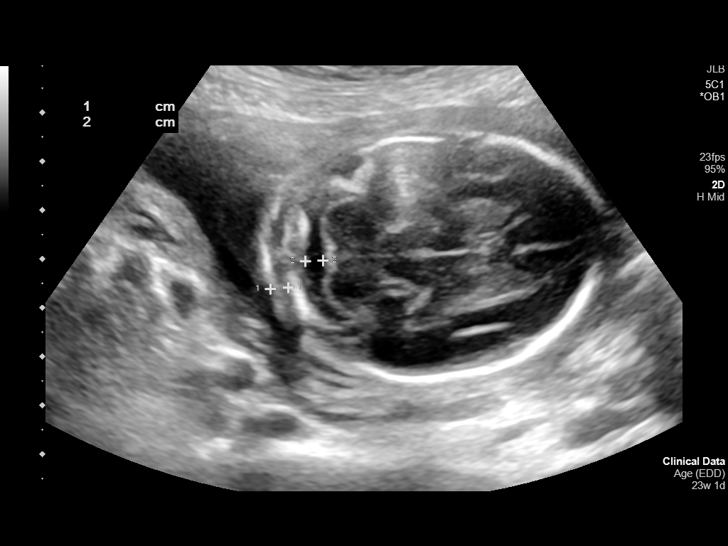

[13 of 28 positions shown; findings below may reference images not displayed]

FINDINGS: Number of Fetuses: 1

Heart Rate:  145 bpm

Movement: Yes

Presentation: Cephalic

Previa: No

Placental Location: Fundal and posterior

Amniotic Fluid (Subjective): Within normal limits

Amniotic Fluid (Objective):

Vertical pocket = 3.4cm

FETAL BIOMETRY

BPD: 5.0cm 21w 2d

HC:   19.7cm 21w 6d

AC:   16.0cm 21w 1d

FL:   3.9cm 22w 2d

Current Mean GA: 22w 0d US EDC: 10/28/2018

Assigned GA:  23w 1d Assigned EDC: 09/21/2018

FETAL ANATOMY

Lateral Ventricles: Appears normal

Thalami/CSP: Appears normal

Posterior Fossa:  Appears normal

Nuchal Region: Appears normal   NFT= N/A > 20 WKS

Upper Lip: Appears normal

Spine: Appears normal

4 Chamber Heart on Left: Appears normal

LVOT: Appears normal

RVOT: Appears normal

Stomach on Left: Appears normal

3 Vessel Cord: Appears normal

Cord Insertion site: Appears normal

Kidneys: Appears normal

Bladder: Appears normal

Extremities: Appears normal

Sex: Male

Maternal Findings:

Cervix:  3.7 cm TA
IMPRESSION: Current assigned gestational age of 23 weeks 1 day by early
ultrasound. Current mean gestational age is measuring 8 days behind.
Recommend follow-up ultrasound evaluation of fetal growth in 4
weeks.

Normal visualized fetal anatomy.  No anomalies identified.

Normal amniotic fluid volume.  Normal cervical length.

## 2019-08-04 NOTE — L&D Delivery Note (Signed)
Delivery Note  Date of delivery: 05/26/2020 Estimated Date of Delivery: 06/11/20 No LMP recorded. Patient is pregnant. EGA: [redacted]w[redacted]d  Delivery Note At 2:42 PM a viable female was delivered via Vaginal, Spontaneous (Presentation: Middle Occiput Anterior).  APGAR: 7, 8; weight  pending.   Placenta status: Spontaneous, Intact.  Cord: 3 vessels with the following complications: None.  Cord pH: n/a  Anesthesia: Epidural Episiotomy: None Lacerations:  none Suture Repair: n/a Est. Blood Loss (mL):  200 mL  First Stage: Labor onset: unknown Induction: Cytotec and pitocin Analgesia /Anesthesia intrapartum: Epidural AROM at 1431  Audrey Beard presented to L&D with elevated BP and IOL was initiated. She was induced with Cytotec and pitocin. Epidural placed.   Second Stage: Complete dilation at 1435 Onset of pushing at 1435 FHR second stage Cat II Delivery at 1442 on 05/26/2020  She progressed to complete and had a spontaneous vaginal birth of a live female over an intact perineum. The fetal head was delivered in OA position with restitution to LOA. No nuchal cord. Anterior then posterior shoulders delivered spontaneously. Baby placed on mom's abdomen and attended to by transition RN. Cord clamped and cut when pulseless by FOB.   Third Stage: Placenta delivered intact with 3VC at 1148 Placenta disposition: Pathology Uterine tone firm / bleeding min IV pitocin given for hemorrhage prophylaxis  Anesthesia: Epidural Episiotomy: None Lacerations:  none Suture Repair: n/a Est. Blood Loss (mL):  200 mL  Complications: none  Mom to postpartum.  Baby to Couplet care / Skin to Skin.  Newborn: Birth Weight: pending  Apgar Scores: 7, 9 Feeding planned: Bottle   Cyril Mourning, CNM 05/26/2020 2:54 PM

## 2020-03-15 ENCOUNTER — Other Ambulatory Visit: Payer: Self-pay | Admitting: Family Medicine

## 2020-03-15 DIAGNOSIS — Z3480 Encounter for supervision of other normal pregnancy, unspecified trimester: Secondary | ICD-10-CM

## 2020-03-22 ENCOUNTER — Other Ambulatory Visit: Payer: Self-pay

## 2020-03-22 ENCOUNTER — Ambulatory Visit
Admission: RE | Admit: 2020-03-22 | Discharge: 2020-03-22 | Disposition: A | Discharge status: Home / Self Care | Payer: Medicaid Other | Source: Ambulatory Visit | Attending: Family Medicine | Admitting: Family Medicine

## 2020-03-22 DIAGNOSIS — Z3480 Encounter for supervision of other normal pregnancy, unspecified trimester: Secondary | ICD-10-CM | POA: Insufficient documentation

## 2020-04-03 ENCOUNTER — Observation Stay
Admission: EM | Admit: 2020-04-03 | Discharge: 2020-04-03 | Disposition: A | Discharge status: Home / Self Care | Payer: Medicaid Other | Attending: Obstetrics and Gynecology | Admitting: Obstetrics and Gynecology

## 2020-04-03 ENCOUNTER — Other Ambulatory Visit: Payer: Self-pay

## 2020-04-03 DIAGNOSIS — O26892 Other specified pregnancy related conditions, second trimester: Secondary | ICD-10-CM | POA: Diagnosis present

## 2020-04-03 DIAGNOSIS — O26899 Other specified pregnancy related conditions, unspecified trimester: Secondary | ICD-10-CM

## 2020-04-03 DIAGNOSIS — R102 Pelvic and perineal pain: Secondary | ICD-10-CM | POA: Insufficient documentation

## 2020-04-03 DIAGNOSIS — Z3A23 23 weeks gestation of pregnancy: Secondary | ICD-10-CM | POA: Diagnosis not present
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
## 2020-04-03 NOTE — Progress Notes (Signed)
Patient ID: Audrey Beard, female   DOB: 1996/01/29, 24 y.o.   MRN: [id number]  Audrey Beard is a 24 y.o. female. She is at [redacted]w[redacted]d gestation. No LMP recorded. Patient is pregnant. Estimated Date of Delivery: 06/11/20  Prenatal care site: [facility]  Chief complaint:lower abd pressure   Location: Onset/timing: Duration: Quality:  Severity: Aggravating or alleviating conditions: Associated signs/symptoms: Context:  S: Resting comfortably. no CTX, no VB.no LOF,  Active fetal movement. +  Maternal Medical History:   Past Medical History:  Diagnosis Date   Anemia    Depression    Pregnancy induced hypertension     Past Surgical History:  Procedure Laterality Date   NO PAST SURGERIES      No Known Allergies  Prior to Admission medications   Medication Sig Start Date End Date Taking? Authorizing Provider  Prenatal Vit-Fe Fumarate-FA (MULTIVITAMIN-PRENATAL) 27-0.8 MG TABS tablet Take 1 tablet by mouth daily at 12 noon.   Yes [provider]  cyclobenzaprine (FLEXERIL) 5 MG tablet Take 1 tablet (5 mg total) by mouth 3 (three) times daily as needed for muscle spasms. Patient not taking: Reported on 04/03/2020 10/13/18   Menshew, Charlesetta Ivory, PA-C     Social History: She  reports that she has been smoking cigarettes. She has a 1.00 pack-year smoking history. She has never used smokeless tobacco. She reports current drug use. Drug: Marijuana. She reports that she does not drink alcohol.  Family History: family history is not on file.  no history of gyn cancers  Review of Systems: A full review of systems was performed and negative except as noted in the HPI.   Pt c/o one day pelvic pressure . Some dark blood vaginal d/c postcoital . Poor prenatal care with recent visit [facility] . EGA 30 +1 week based on  U/s 03/22/20 G5P4 with no prior h/o PTL/ PTD  She was told that she has a UTI , but has not picked up RX  Some decreased movt with increased movt since here . She has not  been instructed on fetal kick counts  O:  BP 128/73 (BP Location: Left Arm)   Pulse 94   Temp 98.3 F (36.8 C) (Oral)   Resp 16   Ht 5\' 3"  (1.6 m)   Wt 77.6 kg   BMI 30.29 kg/m  No results found for this or any previous visit (from the past 48 hour(s)).   Constitutional: NAD, AAOx3  HE/ENT: extraocular movements grossly intact, moist mucous membranes CV: RRR PULM: nl respiratory effort, CTABL     Abd: gravid, non-tender, non-distended, soft      Ext: Non-tender, Nonedmeatous   Psych: mood appropriate, speech normal Pelvic cx closed / 50 % /-3  VTX  NST: 140 + 10x10 accels . Afew variable decels .  Variability: moderate Accelerations present x >2 Decelerations absent  rare ctx  Time    Assessment: 24 y.o. with pelvic pressure . No evidence of PTL currently . + UTI by her report without tx   Principle diagnosis:   Plan: Labor: not present.  Fetal Wellbeing reassuring fetal monitoring  D/c home stable, precautions reviewed, follow-up as scheduled.  MAcrobid rx given . She is instructed to call [facility] to confrim she in on the right rx for UTI bacteria Fetal kick cont instructions given    ----- 30 MD Attending Obstetrician and Gynecologist [facility], Department of OB/GYN [facility]

## 2020-04-03 NOTE — Discharge Summary (Signed)
Patient ID: Audrey Beard, female   DOB: 02-22-1996, 24 y.o.   MRN: [id number]  Subjective [] Expand by Default  Audrey Beard is a 24 y.o. female. She is at [redacted]w[redacted]d gestation. No LMP recorded. Patient is pregnant. Estimated Date of Delivery: 06/11/20  Prenatal care site:  13/9/21  Chief complaint:  Location: Onset/timing: Duration: Quality:  Severity: Aggravating or alleviating conditions: Associated signs/symptoms: Context:  S: Resting comfortably. noCTX, no VB.no LOF, Active fetal movement. +  Maternal Medical History:       Past Medical History:  Diagnosis Date  . Anemia   . Depression   . Pregnancy induced hypertension          Past Surgical History:  Procedure Laterality Date  . NO PAST SURGERIES      No Known Allergies         Prior to Admission medications   Medication Sig Start Date End Date Taking? Authorizing Provider  Prenatal Vit-Fe Fumarate-FA (MULTIVITAMIN-PRENATAL) 27-0.8 MG TABS tablet Take 1 tablet by mouth daily at 12 noon.   Yes [provider]  cyclobenzaprine (FLEXERIL) 5 MG tablet Take 1 tablet (5 mg total) by mouth 3 (three) times daily as needed for muscle spasms. Patient not taking: Reported on 04/03/2020 10/13/18   Menshew, 12/13/18, PA-C     Social History: She  reports that she has been smoking cigarettes. She has a 1.00 pack-year smoking history. She has never used smokeless tobacco. She reports current drug use. Drug: Marijuana. She reports that she does not drink alcohol.  Family History: family history is not on file.  no history of gyn cancers  Review of Systems: A full review of systems was performed and negative except as noted in the HPI.   Pt c/o one day pelvic pressure . Some dark blood vaginal d/c postcoital . Poor prenatal care with recent visit [facility] . EGA 30 +1 week based on  U/s 03/22/20 G5P4 with no prior h/o PTL/ PTD  She was told that she has a UTI , but has not picked up RX   Some decreased movt with increased movt since here . She has not been instructed on fetal kick counts  O:   Objective   BP 128/73 (BP Location: Left Arm)   Pulse 94   Temp 98.3 F (36.8 C) (Oral)   Resp 16   Ht 5\' 3"  (1.6 m)   Wt 77.6 kg   BMI 30.29 kg/m  No results found for this or any previous visit (from the past 48 hour(s)).   Constitutional: NAD, AAOx3  HE/ENT: extraocular movements grossly intact, moist mucous membranes CV: RRR PULM: nl respiratory effort, CTABL                                         Abd: gravid, non-tender, non-distended, soft                                                  Ext: Non-tender, Nonedmeatous                     Psych: mood appropriate, speech normal Pelvic cx closed / 50 % /-3  VTX  NST: 140 + 10x10 accels . Afew variable decels .  Variability: moderate Accelerations present x >2 Decelerations absent  rare ctx  Time    Assessment: 24 y.o. with pelvic pressure . No evidence of PTL currently . + UTI by her report without tx   Principle diagnosis:   Plan:  Labor: not present.   Fetal Wellbeing reassuring fetal monitoring   D/c home stable, precautions reviewed, follow-up as scheduled.   MAcrobid rx given . She is instructed to call [facility] to confrim she in on the right rx for UTI bacteria  Fetal kick cont instructions given    ----- Beverly Gust MD Attending Obstetrician and Gynecologist [facility], Department of OB/GYN [facility]          ED to Hosp-Admission (Current) on 04/03/2020   ED to Hosp-Admission (Current) on 04/03/2020

## 2020-05-25 ENCOUNTER — Inpatient Hospital Stay
Admission: EM | Admit: 2020-05-25 | Discharge: 2020-05-28 | DRG: 807 | Disposition: A | Discharge status: Home / Self Care | Payer: Medicaid Other | Attending: Obstetrics & Gynecology | Admitting: Obstetrics & Gynecology

## 2020-05-25 ENCOUNTER — Other Ambulatory Visit: Payer: Self-pay

## 2020-05-25 ENCOUNTER — Encounter: Payer: Self-pay | Admitting: Obstetrics & Gynecology

## 2020-05-25 DIAGNOSIS — R109 Unspecified abdominal pain: Secondary | ICD-10-CM | POA: Diagnosis present

## 2020-05-25 DIAGNOSIS — O1494 Unspecified pre-eclampsia, complicating childbirth: Secondary | ICD-10-CM | POA: Diagnosis present

## 2020-05-25 DIAGNOSIS — O26893 Other specified pregnancy related conditions, third trimester: Secondary | ICD-10-CM | POA: Diagnosis present

## 2020-05-25 DIAGNOSIS — O99334 Smoking (tobacco) complicating childbirth: Secondary | ICD-10-CM | POA: Diagnosis present

## 2020-05-25 DIAGNOSIS — O1490 Unspecified pre-eclampsia, unspecified trimester: Secondary | ICD-10-CM | POA: Diagnosis present

## 2020-05-25 DIAGNOSIS — O9902 Anemia complicating childbirth: Secondary | ICD-10-CM | POA: Diagnosis present

## 2020-05-25 DIAGNOSIS — O09299 Supervision of pregnancy with other poor reproductive or obstetric history, unspecified trimester: Secondary | ICD-10-CM | POA: Diagnosis present

## 2020-05-25 DIAGNOSIS — Z20822 Contact with and (suspected) exposure to covid-19: Secondary | ICD-10-CM | POA: Diagnosis present

## 2020-05-25 DIAGNOSIS — D509 Iron deficiency anemia, unspecified: Secondary | ICD-10-CM | POA: Diagnosis present

## 2020-05-25 DIAGNOSIS — Z3A37 37 weeks gestation of pregnancy: Secondary | ICD-10-CM

## 2020-05-25 DIAGNOSIS — O0933 Supervision of pregnancy with insufficient antenatal care, third trimester: Secondary | ICD-10-CM

## 2020-05-25 DIAGNOSIS — F17210 Nicotine dependence, cigarettes, uncomplicated: Secondary | ICD-10-CM | POA: Diagnosis present

## 2020-05-25 DIAGNOSIS — O99284 Endocrine, nutritional and metabolic diseases complicating childbirth: Secondary | ICD-10-CM | POA: Diagnosis present

## 2020-05-25 DIAGNOSIS — O26899 Other specified pregnancy related conditions, unspecified trimester: Secondary | ICD-10-CM | POA: Diagnosis present

## 2020-05-25 DIAGNOSIS — E876 Hypokalemia: Secondary | ICD-10-CM | POA: Diagnosis present

## 2020-05-25 DIAGNOSIS — D649 Anemia, unspecified: Secondary | ICD-10-CM | POA: Diagnosis present

## 2020-05-25 DIAGNOSIS — F331 Major depressive disorder, recurrent, moderate: Secondary | ICD-10-CM | POA: Diagnosis present

## 2020-05-25 LAB — COMPREHENSIVE METABOLIC PANEL
ALT: 11 U/L (ref 0–44)
AST: 24 U/L (ref 15–41)
Albumin: 2.7 g/dL — ABNORMAL LOW (ref 3.5–5.0)
Alkaline Phosphatase: 185 U/L — ABNORMAL HIGH (ref 38–126)
Anion gap: 9 (ref 5–15)
BUN: <5 mg/dL — ABNORMAL LOW (ref 6–20)
CO2: 19 mmol/L — ABNORMAL LOW (ref 22–32)
Calcium: 8.3 mg/dL — ABNORMAL LOW (ref 8.9–10.3)
Chloride: 111 mmol/L (ref 98–111)
Creatinine, Ser: 0.44 mg/dL (ref 0.44–1.00)
GFR, Estimated: >60 mL/min (ref >60)
Glucose, Bld: 104 mg/dL — ABNORMAL HIGH (ref 70–99)
Potassium: 2.5 mmol/L — CL (ref 3.5–5.1)
Sodium: 139 mmol/L (ref 135–145)
Total Bilirubin: 1 mg/dL (ref 0.3–1.2)
Total Protein: 6.6 g/dL (ref 6.5–8.1)

## 2020-05-25 LAB — URINE DRUG SCREEN, QUALITATIVE (ARMC ONLY)
Amphetamines, Ur Screen: NOT DETECTED
Barbiturates, Ur Screen: NOT DETECTED
Benzodiazepine, Ur Scrn: NOT DETECTED
Cannabinoid 50 Ng, Ur ~~LOC~~: NOT DETECTED
Cocaine Metabolite,Ur ~~LOC~~: NOT DETECTED
MDMA (Ecstasy)Ur Screen: NOT DETECTED
Methadone Scn, Ur: NOT DETECTED
Opiate, Ur Screen: NOT DETECTED
Phencyclidine (PCP) Ur S: NOT DETECTED
Tricyclic, Ur Screen: NOT DETECTED

## 2020-05-25 LAB — CBC WITH DIFFERENTIAL/PLATELET
Abs Immature Granulocytes: 0.09 10*3/uL — ABNORMAL HIGH (ref 0.00–0.07)
Basophils Absolute: 0 10*3/uL (ref 0.0–0.1)
Basophils Relative: 0 %
Eosinophils Absolute: 0.1 10*3/uL (ref 0.0–0.5)
Eosinophils Relative: 1 %
HCT: 26.9 % — ABNORMAL LOW (ref 36.0–46.0)
Hemoglobin: 8.4 g/dL — ABNORMAL LOW (ref 12.0–15.0)
Immature Granulocytes: 1 %
Lymphocytes Relative: 19 %
Lymphs Abs: 2.6 10*3/uL (ref 0.7–4.0)
MCH: 23.9 pg — ABNORMAL LOW (ref 26.0–34.0)
MCHC: 31.2 g/dL (ref 30.0–36.0)
MCV: 76.4 fL — ABNORMAL LOW (ref 80.0–100.0)
Monocytes Absolute: 1.1 10*3/uL — ABNORMAL HIGH (ref 0.1–1.0)
Monocytes Relative: 8 %
Neutro Abs: 10 10*3/uL — ABNORMAL HIGH (ref 1.7–7.7)
Neutrophils Relative %: 71 %
Platelets: 261 10*3/uL (ref 150–400)
RBC: 3.52 MIL/uL — ABNORMAL LOW (ref 3.87–5.11)
RDW: 17.6 % — ABNORMAL HIGH (ref 11.5–15.5)
WBC: 13.9 10*3/uL — ABNORMAL HIGH (ref 4.0–10.5)
nRBC: 0 % (ref 0.0–0.2)

## 2020-05-25 LAB — PROTEIN / CREATININE RATIO, URINE
Creatinine, Urine: 15 mg/dL
Protein Creatinine Ratio: 0.47 mg/mg{Cre} — ABNORMAL HIGH (ref 0.00–0.15)
Total Protein, Urine: 7 mg/dL

## 2020-05-25 LAB — WET PREP, GENITAL
Sperm: NONE SEEN
Trich, Wet Prep: NONE SEEN
Yeast Wet Prep HPF POC: NONE SEEN

## 2020-05-25 LAB — GROUP B STREP BY PCR: Group B strep by PCR: NEGATIVE

## 2020-05-25 LAB — RUPTURE OF MEMBRANE (ROM)PLUS: Rom Plus: NEGATIVE

## 2020-05-25 MED ORDER — HYDRALAZINE HCL 20 MG/ML IJ SOLN: 10.0000 mg | INTRAMUSCULAR | Status: DC | PRN

## 2020-05-25 MED ORDER — OXYTOCIN-SODIUM CHLORIDE 30-0.9 UT/500ML-% IV SOLN
2.5000 [IU]/h | INTRAVENOUS | Status: DC
  Administered 2020-05-26: 2.5 [IU]/h via INTRAVENOUS

## 2020-05-25 MED ORDER — LABETALOL HCL 5 MG/ML IV SOLN: 40.0000 mg | INTRAVENOUS | Status: DC | PRN

## 2020-05-25 MED ORDER — SODIUM CHLORIDE 0.9 % IV SOLN: 5.0000 10*6.[IU] | Freq: Once | INTRAVENOUS | Status: DC

## 2020-05-25 MED ORDER — LACTATED RINGERS IV SOLN: INTRAVENOUS | Status: DC

## 2020-05-25 MED ORDER — ONDANSETRON HCL 4 MG/2ML IJ SOLN: 4.0000 mg | Freq: Four times a day (QID) | INTRAMUSCULAR | Status: DC | PRN

## 2020-05-25 MED ORDER — OXYTOCIN BOLUS FROM INFUSION
333.0000 mL | Freq: Once | INTRAVENOUS | Status: AC
  Administered 2020-05-26: 333 mL via INTRAVENOUS

## 2020-05-25 MED ORDER — ACETAMINOPHEN 325 MG PO TABS: 650.0000 mg | ORAL_TABLET | ORAL | Status: DC | PRN

## 2020-05-25 MED ORDER — OXYTOCIN 10 UNIT/ML IJ SOLN
INTRAMUSCULAR | Status: AC
  Filled 2020-05-25: qty 2

## 2020-05-25 MED ORDER — AMMONIA AROMATIC IN INHA
RESPIRATORY_TRACT | Status: AC
  Filled 2020-05-25: qty 10

## 2020-05-25 MED ORDER — LABETALOL HCL 5 MG/ML IV SOLN: 80.0000 mg | INTRAVENOUS | Status: DC | PRN

## 2020-05-25 MED ORDER — LIDOCAINE HCL (PF) 1 % IJ SOLN
INTRAMUSCULAR | Status: AC
  Filled 2020-05-25: qty 30

## 2020-05-25 MED ORDER — OXYCODONE-ACETAMINOPHEN 5-325 MG PO TABS: 2.0000 | ORAL_TABLET | ORAL | Status: DC | PRN

## 2020-05-25 MED ORDER — ACETAMINOPHEN 325 MG PO TABS
650.0000 mg | ORAL_TABLET | ORAL | Status: DC | PRN
  Administered 2020-05-25: 650 mg via ORAL
  Filled 2020-05-25: qty 2

## 2020-05-25 MED ORDER — LIDOCAINE HCL (PF) 1 % IJ SOLN: 30.0000 mL | INTRAMUSCULAR | Status: DC | PRN

## 2020-05-25 MED ORDER — LABETALOL HCL 5 MG/ML IV SOLN: 20.0000 mg | INTRAVENOUS | Status: DC | PRN

## 2020-05-25 MED ORDER — LACTATED RINGERS IV SOLN
500.0000 mL | INTRAVENOUS | Status: DC | PRN
  Administered 2020-05-26: 1000 mL via INTRAVENOUS

## 2020-05-25 MED ORDER — OXYCODONE-ACETAMINOPHEN 5-325 MG PO TABS: 1.0000 | ORAL_TABLET | ORAL | Status: DC | PRN

## 2020-05-25 MED ORDER — MISOPROSTOL 200 MCG PO TABS
ORAL_TABLET | ORAL | Status: AC
  Filled 2020-05-25: qty 4

## 2020-05-25 MED ORDER — SOD CITRATE-CITRIC ACID 500-334 MG/5ML PO SOLN: 30.0000 mL | ORAL | Status: DC | PRN

## 2020-05-25 MED ORDER — PENICILLIN G POT IN DEXTROSE 60000 UNIT/ML IV SOLN: 3.0000 10*6.[IU] | INTRAVENOUS | Status: DC

## 2020-05-25 NOTE — OB Triage Note (Signed)
Pt is a G5P4 and [redacted]w[redacted]d presenting to L&D with c/o weakness and pressure in her lower abdomen and a sharp pain vaginally with movement. Pt rates pain 6/10 on a 0-10 pain scale. Pt confirms positive fetal movement and states she had vaginal mucous discharge when she wiped this afternoon. Pt states last intercourse was today. She states she fell asleep at 1300 and woke up at 1500 with a little bit of clear LOF and hasn't noticed any since. Pt has had no prenatal care and was seen one time in triage on 04/03/20. Triage report called to CNM. Monitors applied and assessing. Initial FHT 135.

## 2020-05-25 NOTE — H&P (Addendum)
OB History & Physical   History of Present Illness:  Chief Complaint:   HPI:  Audrey Beard is a 24 y.o. [id number] female at [redacted]w[redacted]d dated by 28 week u/s.  She presents to L&D for vague abdominal and back pain, and vaginal spotting.  IOL for Pre-E.  She reports:  -active fetal movement -no leakage of fluid  -bloody show noted today -no contractions  Pregnancy Issues: 1. No/limited PNC - no records available 2. H/o Pre-E 3. 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 Anemia   . Depression   . Pregnancy induced hypertension     Past Surgical History:  Procedure Laterality Date  . NO PAST SURGERIES      No Known Allergies  Prior to Admission medications   Medication Sig Start Date End Date Taking? Authorizing Provider  Prenatal Vit-Fe Fumarate-FA (MULTIVITAMIN-PRENATAL) 27-0.8 MG TABS tablet Take 1 tablet by mouth daily at 12 noon.   Yes [provider]  cyclobenzaprine (FLEXERIL) 5 MG tablet Take 1 tablet (5 mg total) by mouth 3 (three) times daily as needed for muscle spasms. Patient not taking: Reported on 04/03/2020 10/13/18   Menshew, Charlesetta Ivory, PA-C     Prenatal care site: None or limited [facility], pt states she was seen a couple of times by [facility] but records not available  Social History: She  reports that she has been smoking cigarettes. She has a 1.00 pack-year smoking history. She has never used smokeless tobacco. She reports that she does not drink alcohol and does not use drugs.  Family History: family history is not on file.   Review of Systems: A full review of systems was performed and negative except as noted in the HPI.    Physical Exam:  Vital Signs: BP (!) 102/53   Pulse 85   Temp 98.3 F (36.8 C) (Oral)   Resp 16   Ht 5\' 3"  (1.6 m)   Wt 77.1 kg   SpO2 99%   Breastfeeding Unknown   BMI 30.11 kg/m   General:   alert and cooperative  Skin:  normal  Neurologic:     Alert & oriented x 3  Lungs:   nl effort  Heart:   regular rate and rhythm  Abdomen:  normal findings: soft, non-tender  Extremities: : non-tender, symmetric    Pelvic exam: normal external genitalia, vulva, vagina, cervix, uterus and adnexa.  EFW:03/22/20  1,167g greater than 99%ile  Results for orders placed or performed during the hospital encounter of 05/25/20 (from the past 24 hour(s))  Urinalysis, Complete w Microscopic     Status: Abnormal   Collection Time: 05/25/20 10:22 PM  Result Value Ref Range   Color, Urine STRAW (A) YELLOW   APPearance CLEAR (A) CLEAR   Specific Gravity, Urine 1.001 (L) 1.005 - 1.030   pH 8.0 5.0 - 8.0   Glucose, UA NEGATIVE NEGATIVE mg/dL   Hgb urine dipstick NEGATIVE NEGATIVE   Bilirubin Urine NEGATIVE NEGATIVE   Ketones, ur NEGATIVE NEGATIVE mg/dL   Protein, ur NEGATIVE NEGATIVE mg/dL   Nitrite NEGATIVE NEGATIVE   Leukocytes,Ua NEGATIVE NEGATIVE   WBC, UA 0-5 0 - 5 WBC/hpf   Bacteria, UA RARE (A) NONE SEEN   Squamous Epithelial / LPF NONE SEEN 0 - 5  Group B strep by PCR     Status: None   Collection Time: 05/25/20 10:22 PM   Specimen: Genital  Result Value Ref Range   Group B strep by PCR NEGATIVE NEGATIVE  Wet prep, genital     Status: Abnormal   Collection Time: 05/25/20 10:22 PM  Result Value Ref Range   Yeast Wet Prep HPF POC NONE SEEN NONE SEEN   Trich, Wet Prep NONE SEEN NONE SEEN   Clue Cells Wet Prep HPF POC PRESENT (A) NONE SEEN   WBC, Wet Prep HPF POC MANY (A) NONE SEEN   Sperm NONE SEEN   Chlamydia/NGC rt PCR ([facility] only)     Status: None   Collection Time: 05/25/20 10:22 PM  Result Value Ref Range   Specimen source GC/Chlam URINE, RANDOM    Chlamydia Tr NOT DETECTED NOT DETECTED   N gonorrhoeae NOT DETECTED NOT DETECTED  ROM Plus ([facility] only)     Status: None   Collection Time: 05/25/20 10:22 PM  Result Value Ref Range   Rom Plus NEGATIVE   Urine Drug Screen, Qualitative ([facility] only)     Status: None   Collection Time:  05/25/20 10:22 PM  Result Value Ref Range   Tricyclic, Ur Screen NONE DETECTED NONE DETECTED   Amphetamines, Ur Screen NONE DETECTED NONE DETECTED   MDMA (Ecstasy)Ur Screen NONE DETECTED NONE DETECTED   Cocaine Metabolite,Ur [place] NONE DETECTED NONE DETECTED   Opiate, Ur Screen NONE DETECTED NONE DETECTED   Phencyclidine (PCP) Ur S NONE DETECTED NONE DETECTED   Cannabinoid 50 Ng, Ur [place] NONE DETECTED NONE DETECTED   Barbiturates, Ur Screen NONE DETECTED NONE DETECTED   Benzodiazepine, Ur Scrn NONE DETECTED NONE DETECTED   Methadone Scn, Ur NONE DETECTED NONE DETECTED  Protein / creatinine ratio, urine     Status: Abnormal   Collection Time: 05/25/20 10:29 PM  Result Value Ref Range   Creatinine, Urine 15 mg/dL   Total Protein, Urine 7 mg/dL   Protein Creatinine Ratio 0.47 (H) 0.00 - 0.15 mg/mg[Cre]  CBC with Differential/Platelet     Status: Abnormal   Collection Time: 05/25/20 10:41 PM  Result Value Ref Range   WBC 13.9 (H) 4.0 - 10.5 K/uL   RBC 3.52 (L) 3.87 - 5.11 MIL/uL   Hemoglobin 8.4 (L) 12.0 - 15.0 g/dL   HCT 59.5 (L) 36 - 46 %   MCV 76.4 (L) 80.0 - 100.0 fL   MCH 23.9 (L) 26.0 - 34.0 pg   MCHC 31.2 30.0 - 36.0 g/dL   RDW 63.8 (H) 75.6 - 43.3 %   Platelets 261 150 - 400 K/uL   nRBC 0.0 0.0 - 0.2 %   Neutrophils Relative % 71 %   Neutro Abs 10.0 (H) 1.7 - 7.7 K/uL   Lymphocytes Relative 19 %   Lymphs Abs 2.6 0.7 - 4.0 K/uL   Monocytes Relative 8 %   Monocytes Absolute 1.1 (H) 0.1 - 1.0 K/uL   Eosinophils Relative 1 %   Eosinophils Absolute 0.1 0.0 - 0.5 K/uL   Basophils Relative 0 %   Basophils Absolute 0.0 0.0 - 0.1 K/uL   Immature Granulocytes 1 %   Abs Immature Granulocytes 0.09 (H) 0.00 - 0.07 K/uL  Comprehensive metabolic panel     Status: Abnormal   Collection Time: 05/25/20 10:41 PM  Result Value Ref Range   Sodium 139 135 - 145 mmol/L   Potassium 2.5 (LL) 3.5 - 5.1 mmol/L   Chloride 111 98 - 111 mmol/L   CO2 19 (L) 22 - 32 mmol/L   Glucose, Bld 104 (H) 70  - 99 mg/dL  BUN <5 (L) 6 - 20 mg/dL   Creatinine, Ser 4.81 0.44 - 1.00 mg/dL   Calcium 8.3 (L) 8.9 - 10.3 mg/dL   Total Protein 6.6 6.5 - 8.1 g/dL   Albumin 2.7 (L) 3.5 - 5.0 g/dL   AST 24 15 - 41 U/L   ALT 11 0 - 44 U/L   Alkaline Phosphatase 185 (H) 38 - 126 U/L   Total Bilirubin 1.0 0.3 - 1.2 mg/dL   GFR, Estimated >85 >63 mL/min   Anion gap 9 5 - 15  Type and screen [facility]     Status: None (Preliminary result)   Collection Time: 05/25/20 11:54 PM  Result Value Ref Range   ABO/RH(D) AB POS    Antibody Screen NEG    Sample Expiration 05/28/2020,2359    Unit Number [id number]    Blood Component Type RED CELLS,LR    Unit division 00    Status of Unit ISSUED    Transfusion Status OK TO TRANSFUSE    Crossmatch Result      Compatible Performed at [facility], [address] [place], [place] [address]   RPR     Status: None   Collection Time: 05/25/20 11:54 PM  Result Value Ref Range   RPR Ser Ql NON REACTIVE NON REACTIVE  Respiratory Panel by RT PCR (Flu A&B, Covid) - Nasopharyngeal Swab     Status: None   Collection Time: 05/25/20 11:54 PM   Specimen: Nasopharyngeal Swab  Result Value Ref Range   SARS Coronavirus 2 by RT PCR NEGATIVE NEGATIVE   Influenza A by PCR NEGATIVE NEGATIVE   Influenza B by PCR NEGATIVE NEGATIVE  Prepare RBC (crossmatch)     Status: None   Collection Time: 05/26/20 12:09 AM  Result Value Ref Range   Order Confirmation      ORDER PROCESSED BY BLOOD BANK Performed at [facility], [address] [place], [place] [address]   Hepatitis B surface antigen     Status: None   Collection Time: 05/26/20 11:16 AM  Result Value Ref Range   Hepatitis B Surface Ag NON REACTIVE NON REACTIVE  Rapid HIV screen [facility] L&D dept ONLY)     Status: None   Collection Time: 05/26/20 11:16 AM  Result Value Ref Range   HIV-1 P24 Antigen - HIV24 NON REACTIVE NON REACTIVE   HIV 1/2 Antibodies NON REACTIVE NON REACTIVE    Interpretation (HIV Ag Ab)      A non reactive test result means that HIV 1 or HIV 2 antibodies and HIV 1 p24 antigen were not detected in the specimen.  CBC with Differential/Platelet     Status: Abnormal   Collection Time: 05/26/20 11:16 AM  Result Value Ref Range   WBC 14.0 (H) 4.0 - 10.5 K/uL   RBC 3.62 (L) 3.87 - 5.11 MIL/uL   Hemoglobin 8.9 (L) 12.0 - 15.0 g/dL   HCT 86.7 (L) 36 - 46 %   MCV 78.2 (L) 80.0 - 100.0 fL   MCH 24.6 (L) 26.0 - 34.0 pg   MCHC 31.4 30.0 - 36.0 g/dL   RDW 67.2 (H) 09.4 - 70.9 %   Platelets 267 150 - 400 K/uL   nRBC 0.1 0.0 - 0.2 %   Neutrophils Relative % 72 %   Neutro Abs 10.1 (H) 1.7 - 7.7 K/uL   Lymphocytes Relative 17 %   Lymphs Abs 2.4 0.7 - 4.0 K/uL   Monocytes Relative 9 %  Monocytes Absolute 1.3 (H) 0.1 - 1.0 K/uL   Eosinophils Relative 1 %   Eosinophils Absolute 0.1 0.0 - 0.5 K/uL   Basophils Relative 0 %   Basophils Absolute 0.0 0.0 - 0.1 K/uL   Immature Granulocytes 1 %   Abs Immature Granulocytes 0.11 (H) 0.00 - 0.07 K/uL    Pertinent Results:  Prenatal Labs: Blood type/Rh AB pos  Antibody screen neg  Rubella Pending  Varicella Pending  RPR Pending  HBsAg Pending  HIV Pending  GC neg  Chlamydia neg  Genetic screening   1 hour GTT   3 hour GTT   GBS Neg   FHT: FHR: 120 bpm, variability: moderate,  accelerations:  Present,  decelerations:  Absent Category/reactivity:  Category I TOCO: occasional  SVE: Dilation: 10 / Effacement (%): 100 / Station: -1      Assessment:  Rhunette CroftKayla A Kube is a 24 y.o. [id number] female at 6281w4d with Pre-E.Marland Kitchen.   Plan:  1. Admit to Labor & Delivery; consents reviewed and obtained  C/w Dr. Elesa MassedWard for the following plan  2. Pre-E - BPs 140s/90s   PCR 470 - Induction of labor - Conditional orders for Labetalol and Hydralazine  3. Severe  - Hgb 8.4 - 1 unit pRBCs order to be administered prior to induction  4. Hypokalemia - Potassium Chloride 40mEq TID ordered  5. Fetal Well being  -  Fetal Tracing: Cat I - GBS neg - Presentation: vtx confirmed by u/s   6. Routine OB: - Prenatal labs reviewed, as above - Rh Pos - CBC & T&S on admit - Clear fluids, IVF  7. Induction of Labor -  Contractions external toco in place -  Pelvis proven to 3280g -  Plan for induction with Cytotec followed by Pitocin -  Plan for continuous fetal monitoring  -  Maternal pain control as desired: IVPM, nitrous, regional anesthesia - Anticipate vaginal delivery  8. Post Partum Planning: - Infant feeding: bottle - Contraception: TBD  Dorenda Pfannenstiel, CNM 05/26/2020 3:09 PM

## 2020-05-26 ENCOUNTER — Encounter: Payer: Self-pay | Admitting: Obstetrics & Gynecology

## 2020-05-26 ENCOUNTER — Inpatient Hospital Stay: Payer: Medicaid Other | Admitting: Anesthesiology

## 2020-05-26 ENCOUNTER — Inpatient Hospital Stay: Payer: Medicaid Other

## 2020-05-26 DIAGNOSIS — D649 Anemia, unspecified: Secondary | ICD-10-CM | POA: Diagnosis present

## 2020-05-26 DIAGNOSIS — E876 Hypokalemia: Secondary | ICD-10-CM | POA: Diagnosis present

## 2020-05-26 LAB — CBC WITH DIFFERENTIAL/PLATELET
Abs Immature Granulocytes: 0.11 10*3/uL — ABNORMAL HIGH (ref 0.00–0.07)
Basophils Absolute: 0 10*3/uL (ref 0.0–0.1)
Basophils Relative: 0 %
Eosinophils Absolute: 0.1 10*3/uL (ref 0.0–0.5)
Eosinophils Relative: 1 %
HCT: 28.3 % — ABNORMAL LOW (ref 36.0–46.0)
Hemoglobin: 8.9 g/dL — ABNORMAL LOW (ref 12.0–15.0)
Immature Granulocytes: 1 %
Lymphocytes Relative: 17 %
Lymphs Abs: 2.4 10*3/uL (ref 0.7–4.0)
MCH: 24.6 pg — ABNORMAL LOW (ref 26.0–34.0)
MCHC: 31.4 g/dL (ref 30.0–36.0)
MCV: 78.2 fL — ABNORMAL LOW (ref 80.0–100.0)
Monocytes Absolute: 1.3 10*3/uL — ABNORMAL HIGH (ref 0.1–1.0)
Monocytes Relative: 9 %
Neutro Abs: 10.1 10*3/uL — ABNORMAL HIGH (ref 1.7–7.7)
Neutrophils Relative %: 72 %
Platelets: 267 10*3/uL (ref 150–400)
RBC: 3.62 MIL/uL — ABNORMAL LOW (ref 3.87–5.11)
RDW: 17.3 % — ABNORMAL HIGH (ref 11.5–15.5)
WBC: 14 10*3/uL — ABNORMAL HIGH (ref 4.0–10.5)
nRBC: 0.1 % (ref 0.0–0.2)

## 2020-05-26 LAB — URINALYSIS, COMPLETE (UACMP) WITH MICROSCOPIC
Bilirubin Urine: NEGATIVE
Glucose, UA: NEGATIVE mg/dL
Hgb urine dipstick: NEGATIVE
Ketones, ur: NEGATIVE mg/dL
Leukocytes,Ua: NEGATIVE
Nitrite: NEGATIVE
Protein, ur: NEGATIVE mg/dL
Specific Gravity, Urine: 1.001 — ABNORMAL LOW (ref 1.005–1.030)
Squamous Epithelial / HPF: NONE SEEN (ref 0–5)
pH: 8 (ref 5.0–8.0)

## 2020-05-26 LAB — CHLAMYDIA/NGC RT PCR (ARMC ONLY)
Chlamydia Tr: NOT DETECTED
N gonorrhoeae: NOT DETECTED

## 2020-05-26 LAB — RPR: RPR Ser Ql: NONREACTIVE

## 2020-05-26 LAB — RAPID HIV SCREEN (HIV 1/2 AB+AG)
HIV 1/2 Antibodies: NONREACTIVE
HIV-1 P24 Antigen - HIV24: NONREACTIVE

## 2020-05-26 LAB — HEPATITIS B SURFACE ANTIGEN: Hepatitis B Surface Ag: NONREACTIVE

## 2020-05-26 LAB — RESPIRATORY PANEL BY RT PCR (FLU A&B, COVID)
Influenza A by PCR: NEGATIVE
Influenza B by PCR: NEGATIVE
SARS Coronavirus 2 by RT PCR: NEGATIVE

## 2020-05-26 LAB — PREPARE RBC (CROSSMATCH)

## 2020-05-26 MED ORDER — ONDANSETRON HCL 4 MG/2ML IJ SOLN: 4.0000 mg | INTRAMUSCULAR | Status: DC | PRN

## 2020-05-26 MED ORDER — LIDOCAINE-EPINEPHRINE (PF) 1.5 %-1:200000 IJ SOLN
INTRAMUSCULAR | Status: DC | PRN
  Administered 2020-05-26: 3 mL via PERINEURAL

## 2020-05-26 MED ORDER — METHYLERGONOVINE MALEATE 0.2 MG PO TABS
0.2000 mg | ORAL_TABLET | ORAL | Status: DC | PRN
  Filled 2020-05-26: qty 1

## 2020-05-26 MED ORDER — INFLUENZA VAC SPLIT QUAD 0.5 ML IM SUSY
0.5000 mL | PREFILLED_SYRINGE | INTRAMUSCULAR | Status: DC | PRN
  Filled 2020-05-26: qty 0.5

## 2020-05-26 MED ORDER — TETANUS-DIPHTH-ACELL PERTUSSIS 5-2.5-18.5 LF-MCG/0.5 IM SUSP
0.5000 mL | Freq: Once | INTRAMUSCULAR | Status: DC
  Filled 2020-05-26: qty 0.5

## 2020-05-26 MED ORDER — TETANUS-DIPHTH-ACELL PERTUSSIS 5-2.5-18.5 LF-MCG/0.5 IM SUSP
0.5000 mL | INTRAMUSCULAR | Status: DC | PRN
  Filled 2020-05-26: qty 0.5

## 2020-05-26 MED ORDER — WITCH HAZEL-GLYCERIN EX PADS: 1.0000 "application " | MEDICATED_PAD | CUTANEOUS | Status: DC | PRN

## 2020-05-26 MED ORDER — FENTANYL 2.5 MCG/ML W/ROPIVACAINE 0.15% IN NS 100 ML EPIDURAL (ARMC)
12.0000 mL/h | EPIDURAL | Status: DC
  Administered 2020-05-26: 12 mL/h via EPIDURAL

## 2020-05-26 MED ORDER — PRENATAL MULTIVITAMIN CH
1.0000 | ORAL_TABLET | Freq: Every day | ORAL | Status: DC
  Administered 2020-05-27 – 2020-05-28 (×2): 1 via ORAL
  Filled 2020-05-26 (×2): qty 1

## 2020-05-26 MED ORDER — EPHEDRINE 5 MG/ML INJ: 10.0000 mg | INTRAVENOUS | Status: DC | PRN

## 2020-05-26 MED ORDER — OXYTOCIN-SODIUM CHLORIDE 30-0.9 UT/500ML-% IV SOLN
1.0000 m[IU]/min | INTRAVENOUS | Status: DC
  Administered 2020-05-26: 1 m[IU]/min via INTRAVENOUS
  Filled 2020-05-26: qty 1000

## 2020-05-26 MED ORDER — MEDROXYPROGESTERONE ACETATE 150 MG/ML IM SUSP
150.0000 mg | INTRAMUSCULAR | Status: DC | PRN
  Filled 2020-05-26 (×2): qty 1

## 2020-05-26 MED ORDER — OXYCODONE HCL 5 MG PO TABS: 10.0000 mg | ORAL_TABLET | ORAL | Status: DC | PRN

## 2020-05-26 MED ORDER — FENTANYL CITRATE (PF) 100 MCG/2ML IJ SOLN: 100.0000 ug | Freq: Once | INTRAMUSCULAR | Status: AC

## 2020-05-26 MED ORDER — DOCUSATE SODIUM 100 MG PO CAPS
100.0000 mg | ORAL_CAPSULE | Freq: Two times a day (BID) | ORAL | Status: DC
  Administered 2020-05-26 – 2020-05-27 (×3): 100 mg via ORAL
  Filled 2020-05-26 (×4): qty 1

## 2020-05-26 MED ORDER — FENTANYL CITRATE (PF) 100 MCG/2ML IJ SOLN
INTRAMUSCULAR | Status: AC
  Administered 2020-05-26: 100 ug via INTRAVENOUS
  Filled 2020-05-26: qty 2

## 2020-05-26 MED ORDER — SIMETHICONE 80 MG PO CHEW: 80.0000 mg | CHEWABLE_TABLET | ORAL | Status: DC | PRN

## 2020-05-26 MED ORDER — METHYLERGONOVINE MALEATE 0.2 MG PO TABS
0.2000 mg | ORAL_TABLET | ORAL | Status: DC
  Administered 2020-05-26 – 2020-05-27 (×4): 0.2 mg via ORAL
  Filled 2020-05-26 (×4): qty 1

## 2020-05-26 MED ORDER — IBUPROFEN 600 MG PO TABS
600.0000 mg | ORAL_TABLET | Freq: Four times a day (QID) | ORAL | Status: DC
  Administered 2020-05-26 – 2020-05-27 (×3): 600 mg via ORAL
  Filled 2020-05-26 (×3): qty 1

## 2020-05-26 MED ORDER — DIPHENHYDRAMINE HCL 50 MG/ML IJ SOLN: 12.5000 mg | INTRAMUSCULAR | Status: DC | PRN

## 2020-05-26 MED ORDER — MISOPROSTOL 25 MCG QUARTER TABLET
ORAL_TABLET | ORAL | Status: AC
  Filled 2020-05-26: qty 2

## 2020-05-26 MED ORDER — LIDOCAINE HCL (PF) 1 % IJ SOLN
INTRAMUSCULAR | Status: DC | PRN
  Administered 2020-05-26: 3 mL

## 2020-05-26 MED ORDER — OXYCODONE HCL 5 MG PO TABS: 5.0000 mg | ORAL_TABLET | ORAL | Status: DC | PRN

## 2020-05-26 MED ORDER — MISOPROSTOL 25 MCG QUARTER TABLET
25.0000 ug | ORAL_TABLET | ORAL | Status: DC | PRN
  Administered 2020-05-26: 25 ug via VAGINAL

## 2020-05-26 MED ORDER — BUPIVACAINE HCL (PF) 0.25 % IJ SOLN
INTRAMUSCULAR | Status: DC | PRN
  Administered 2020-05-26: 4 mL via EPIDURAL
  Administered 2020-05-26: 6 mL via EPIDURAL

## 2020-05-26 MED ORDER — BENZOCAINE-MENTHOL 20-0.5 % EX AERO
1.0000 "application " | INHALATION_SPRAY | CUTANEOUS | Status: DC | PRN
  Administered 2020-05-27: 1 via TOPICAL
  Filled 2020-05-26: qty 56

## 2020-05-26 MED ORDER — ACETAMINOPHEN 325 MG PO TABS: 650.0000 mg | ORAL_TABLET | ORAL | Status: DC | PRN

## 2020-05-26 MED ORDER — PHENYLEPHRINE 40 MCG/ML (10ML) SYRINGE FOR IV PUSH (FOR BLOOD PRESSURE SUPPORT): 80.0000 ug | PREFILLED_SYRINGE | INTRAVENOUS | Status: DC | PRN

## 2020-05-26 MED ORDER — LACTATED RINGERS IV SOLN
500.0000 mL | Freq: Once | INTRAVENOUS | Status: AC
  Administered 2020-05-26: 500 mL via INTRAVENOUS

## 2020-05-26 MED ORDER — FENTANYL 2.5 MCG/ML W/ROPIVACAINE 0.15% IN NS 100 ML EPIDURAL (ARMC)
EPIDURAL | Status: AC
  Filled 2020-05-26: qty 100

## 2020-05-26 MED ORDER — COCONUT OIL OIL
1.0000 "application " | TOPICAL_OIL | Status: DC | PRN
  Filled 2020-05-26: qty 120

## 2020-05-26 MED ORDER — METHYLERGONOVINE MALEATE 0.2 MG/ML IJ SOLN: 0.2000 mg | INTRAMUSCULAR | Status: DC

## 2020-05-26 MED ORDER — TERBUTALINE SULFATE 1 MG/ML IJ SOLN: 0.2500 mg | Freq: Once | INTRAMUSCULAR | Status: DC | PRN

## 2020-05-26 MED ORDER — DIPHENHYDRAMINE HCL 25 MG PO CAPS: 25.0000 mg | ORAL_CAPSULE | Freq: Four times a day (QID) | ORAL | Status: DC | PRN

## 2020-05-26 MED ORDER — MISOPROSTOL 25 MCG QUARTER TABLET
25.0000 ug | ORAL_TABLET | ORAL | Status: DC
  Administered 2020-05-26: 25 ug via BUCCAL

## 2020-05-26 MED ORDER — POTASSIUM CHLORIDE CRYS ER 20 MEQ PO TBCR
40.0000 meq | EXTENDED_RELEASE_TABLET | Freq: Three times a day (TID) | ORAL | Status: DC
  Administered 2020-05-26 – 2020-05-28 (×7): 40 meq via ORAL
  Filled 2020-05-26 (×4): qty 2
  Filled 2020-05-26: qty 4
  Filled 2020-05-26: qty 2
  Filled 2020-05-26: qty 4

## 2020-05-26 MED ORDER — METHYLERGONOVINE MALEATE 0.2 MG/ML IJ SOLN: 0.2000 mg | INTRAMUSCULAR | Status: DC | PRN

## 2020-05-26 MED ORDER — DIBUCAINE (PERIANAL) 1 % EX OINT: 1.0000 "application " | TOPICAL_OINTMENT | CUTANEOUS | Status: DC | PRN

## 2020-05-26 MED ORDER — ONDANSETRON HCL 4 MG PO TABS
4.0000 mg | ORAL_TABLET | ORAL | Status: DC | PRN
  Filled 2020-05-26: qty 1

## 2020-05-26 MED ORDER — SODIUM CHLORIDE 0.9% IV SOLUTION: Freq: Once | INTRAVENOUS | Status: AC
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
## 2020-05-26 NOTE — Anesthesia Preprocedure Evaluation (Signed)
Anesthesia Evaluation  Patient identified by MRN, date of birth, ID band Patient awake    Reviewed: Allergy & Precautions, H&P , NPO status , Patient's Chart, lab work & pertinent test results  Airway Mallampati: III  TM Distance: >3 FB     Dental  (+) Teeth Intact   Pulmonary neg pulmonary ROS, Current Smoker,           Cardiovascular Exercise Tolerance: Good hypertension,   Pre-ecclampsia with previous pregnancy.  No issues with current pregnancy   Neuro/Psych PSYCHIATRIC DISORDERS Depression    GI/Hepatic negative GI ROS, (+)     substance abuse  marijuana use,   Endo/Other    Renal/GU   negative genitourinary   Musculoskeletal   Abdominal   Peds  Hematology  (+) Blood dyscrasia, anemia ,   Anesthesia Other Findings Past Medical History: No date: Anemia No date: Depression No date: Pregnancy induced hypertension  Past Surgical History: No date: NO PAST SURGERIES  BMI    Body Mass Index:  30.11 kg/m      Reproductive/Obstetrics (+) Pregnancy                             Anesthesia Physical  Anesthesia Plan  ASA: II  Anesthesia Plan: Epidural   Post-op Pain Management:    Induction:   PONV Risk Score and Plan:   Airway Management Planned:   Additional Equipment:   Intra-op Plan:   Post-operative Plan:   Informed Consent: I have reviewed the patients History and Physical, chart, labs and discussed the procedure including the risks, benefits and alternatives for the proposed anesthesia with the patient or authorized representative who has indicated his/her understanding and acceptance.       Plan Discussed with: Anesthesiologist  Anesthesia Plan Comments:         Anesthesia Quick Evaluation

## 2020-05-26 NOTE — Progress Notes (Signed)
Labor Progress Note  Audrey Beard is a 24 y.o. [id number] at [redacted]w[redacted]d by ultrasound admitted for induction of labor due to Pre-eclamptic toxemia of pregnancy..  Subjective: Pt is much more uncomfortable and asking for epidural.   Objective: T:  P: 89 R:  BP: 138/88   Fetal Assessment: FHT:  FHR: 135 bpm, variability: moderate,  accelerations:  Present,  decelerations:  Absent Category/reactivity:  Category I UC:   Regular every 1 mins SVE:    Dilation: 4 cm   Effacement: 80%  Station:  -2  Consistency: soft  Position: middle  Membrane status: Intact Amniotic color: n/a  Assessment / Plan: Induction of labor due to preeclampsia  Labor: Progressing on Pitocin, will continue to increase then AROM Pit @ 5mU Preeclampsia:  138/88, elevated in PCR at admission, no s/s of pre-E with severe features. Fetal Wellbeing:  Category I Pain Control:  Epidural I/D:  Afebrile, GBS neg, Intact Anticipated MOD:  NSVD  Audrey Beard, CNM 05/26/2020, 12:56 PM

## 2020-05-26 NOTE — Progress Notes (Signed)
Patient taken down to ultrasound by RN

## 2020-05-26 NOTE — Progress Notes (Addendum)
Labor Progress Note  Audrey Beard is a 24 y.o. [id number] at [redacted]w[redacted]d by ultrasound admitted for induction of labor due to Pre-eclamptic toxemia of pregnancy..  Subjective: Sleeping before we enter.   Objective: T: 98 P: 89 R: 18 BP: 116/67   Fetal Assessment: FHT:  FHR: 125 bpm, variability: moderate,  accelerations:  Present,  decelerations:  Absent Category/reactivity:  Category I UC:   none SVE:    Dilation: Closed FT  Effacement: 50%  Station:  -3  Consistency: medium  Position: posterior  Membrane status: Intact Amniotic color: n/a  Assessment / Plan: Induction of labor due to preeclampsia  Labor: First dose of cytotec placed now Preeclampsia:  116/67, elevated in PCR at admission, no s/s of pre-E with severe features. Fetal Wellbeing:  Category I Pain Control:  Labor support without medications I/D:  Afebrile, GBS neg, Intact Anticipated MOD:  NSVD  Cyril Mourning, CNM 05/26/2020, 12:40 PM

## 2020-05-26 NOTE — Progress Notes (Signed)
Labor Progress Note  Audrey Beard is a 24 y.o. [id number] at [redacted]w[redacted]d by ultrasound admitted for induction of labor due to Pre-eclamptic toxemia of pregnancy..  Subjective: Pt speaking with family or friend on the phone. Reporting more intense UCs.  Recently given Fentanyl at her request. Reports very good relief with medication.  Objective: T: 97.6 P: 90 R:  BP: 142/101   Fetal Assessment: FHT:  FHR: 135 bpm, variability: moderate,  accelerations:  Present,  decelerations:  Absent Category/reactivity:  Category I UC:   Regular every 2 mins SVE:    Dilation: 2cm   Effacement: 60%  Station:  -3  Consistency: medium  Position: middle  Membrane status: Intact Amniotic color: n/a  Assessment / Plan: Induction of labor due to preeclampsia  Labor: 4 hour post cytotec, UCs q 2 mins, Pitocin will be started. Preeclampsia:  142/101, elevated in PCR at admission, no s/s of pre-E with severe features. Fetal Wellbeing:  Category I Pain Control:  IV pain meds I/D:  Afebrile, GBS neg, Intact Anticipated MOD:  NSVD  Cyril Mourning, CNM 05/26/2020, 12:48 PM

## 2020-05-26 NOTE — Lactation Note (Signed)
This note was copied from a baby's chart. Lactation Consultation Note  Patient Name: Audrey Beard [id number] Date: 05/26/2020   This is mom's 5th baby to bottle feed formula.  Hand out given on Infant Formula Preparation reviewing storage, cleaning and sanitizing the workspace, bottles and feeding and preparation equipment, how to prepare infant powdered formula safely and preventing cronobacter.  Mom reports planning to use big jugs of bottled water.  Mom denies getting engorged with other babies.  Explained how to dry up her milk by wearing a well-fitting supportive bra, cold, cabbage leaves, not stimulating or extracting milk and no warmth when milk would be transitioning.  Discussed differences, prevention and treatment of full breasts, engorgement, plugged ducts and mastitis and when to seek further treatment.  Encouraged mom to call with any questions, concerns or assistance.  Maternal Data Formula Feeding for Exclusion: Yes Reason for exclusion: Mother's choice to formula feed on admision  Feeding Feeding Type: Bottle Fed - Formula Nipple Type: Regular  LATCH Score                   Interventions    Lactation Tools Discussed/Used     Consult Status      Louis Meckel 05/26/2020, 6:59 PM

## 2020-05-26 NOTE — Anesthesia Procedure Notes (Signed)
Epidural Patient location during procedure: OB Start time: 05/26/2020 1:03 PM End time: 05/26/2020 1:19 PM  Staffing Anesthesiologist: Yves Dill, MD Performed: anesthesiologist   Preanesthetic Checklist Completed: patient identified, IV checked, site marked, risks and benefits discussed, surgical consent, monitors and equipment checked, pre-op evaluation and timeout performed  Epidural Patient position: sitting Prep: Betadine Patient monitoring: heart rate, continuous pulse ox and blood pressure Approach: midline Location: L3-L4 Injection technique: LOR air  Needle:  Needle type: Tuohy  Needle gauge: 17 G Needle length: 9 cm and 9 Catheter type: closed end flexible Catheter size: 19 Gauge Test dose: negative and 1.5% lidocaine with Epi 1:200 K  Assessment Events: blood not aspirated, injection not painful, no injection resistance, no paresthesia and negative IV test  Additional Notes Time out called.  Patient placed in sitting position and back prepped and draped in sterile fashion.  Patient had challenging time in maintaining position, but we worked to achieve good position.  A skin wheal was made in the L3-L4 interspace with 1% Lidocaine plain.  A 17G Tuohy needle was advanced into the epidural space by a loss of resistance technique.  The epidural catheter was threaded 3 cm and the TD was negative.  No blood, fluid or paresthesias.  The patient tolerated the procedure well and the catheter was affixed to the back in sterile fashion.Reason for block:procedure for pain

## 2020-05-26 NOTE — Progress Notes (Addendum)
Blood transfusion started and verified with Graciela Husbands RN

## 2020-05-27 LAB — BPAM RBC
Blood Product Expiration Date: 202111182359
ISSUE DATE / TIME: 202110240151
Unit Type and Rh: 6200

## 2020-05-27 LAB — VARICELLA ZOSTER ANTIBODY, IGG: Varicella IgG: 1080 index (ref >165)

## 2020-05-27 LAB — CBC
HCT: 30 % — ABNORMAL LOW (ref 36.0–46.0)
Hemoglobin: 9.4 g/dL — ABNORMAL LOW (ref 12.0–15.0)
MCH: 24.4 pg — ABNORMAL LOW (ref 26.0–34.0)
MCHC: 31.3 g/dL (ref 30.0–36.0)
MCV: 77.7 fL — ABNORMAL LOW (ref 80.0–100.0)
Platelets: 273 10*3/uL (ref 150–400)
RBC: 3.86 MIL/uL — ABNORMAL LOW (ref 3.87–5.11)
RDW: 17.5 % — ABNORMAL HIGH (ref 11.5–15.5)
WBC: 15.9 10*3/uL — ABNORMAL HIGH (ref 4.0–10.5)
nRBC: 0.1 % (ref 0.0–0.2)

## 2020-05-27 LAB — TYPE AND SCREEN
ABO/RH(D): AB POS
Antibody Screen: NEGATIVE
Unit division: 0

## 2020-05-27 LAB — COMPREHENSIVE METABOLIC PANEL
ALT: 10 U/L (ref 0–44)
AST: 19 U/L (ref 15–41)
Albumin: 2.4 g/dL — ABNORMAL LOW (ref 3.5–5.0)
Alkaline Phosphatase: 156 U/L — ABNORMAL HIGH (ref 38–126)
Anion gap: 7 (ref 5–15)
BUN: <5 mg/dL — ABNORMAL LOW (ref 6–20)
CO2: 20 mmol/L — ABNORMAL LOW (ref 22–32)
Calcium: 8.6 mg/dL — ABNORMAL LOW (ref 8.9–10.3)
Chloride: 110 mmol/L (ref 98–111)
Creatinine, Ser: 0.58 mg/dL (ref 0.44–1.00)
GFR, Estimated: >60 mL/min (ref >60)
Glucose, Bld: 86 mg/dL (ref 70–99)
Potassium: 3 mmol/L — ABNORMAL LOW (ref 3.5–5.1)
Sodium: 137 mmol/L (ref 135–145)
Total Bilirubin: 0.8 mg/dL (ref 0.3–1.2)
Total Protein: 6 g/dL — ABNORMAL LOW (ref 6.5–8.1)

## 2020-05-27 LAB — URINE CULTURE

## 2020-05-27 LAB — RUBELLA SCREEN: Rubella: <0.9 index — ABNORMAL LOW (ref >0.99)

## 2020-05-27 MED ORDER — IBUPROFEN 600 MG PO TABS
600.0000 mg | ORAL_TABLET | Freq: Four times a day (QID) | ORAL | Status: DC
  Administered 2020-05-27 – 2020-05-28 (×6): 600 mg via ORAL
  Filled 2020-05-27 (×6): qty 1

## 2020-05-27 MED ORDER — NIFEDIPINE ER OSMOTIC RELEASE 30 MG PO TB24
30.0000 mg | ORAL_TABLET | Freq: Every day | ORAL | Status: DC
  Administered 2020-05-27: 30 mg via ORAL
  Filled 2020-05-27: qty 1

## 2020-05-27 MED ORDER — NIFEDIPINE ER OSMOTIC RELEASE 30 MG PO TB24
60.0000 mg | ORAL_TABLET | Freq: Every day | ORAL | Status: DC
  Administered 2020-05-28: 60 mg via ORAL
  Filled 2020-05-27: qty 2
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
## 2020-05-27 NOTE — Progress Notes (Addendum)
Spoke to CPS Intake [employer]. He reported they are screening in the report and most likely a CPS Worker will be out this afternoon to meet with MOB. He asked if MOB had any prenatal care, informed him that she did not. Also informed him of 2 addresses (one in chart and one that MOB provided during TOC assessment).  Audrey Beard is aware that MOB and Baby are scheduled to discharge tomorrow morning and that we need to be informed of any barriers to discharge before then.  Updated CNM and RN.   3:45- CPS Worker is Pricilla [phone]. CSW reached out to [facility] and asked to be informed of any barriers to discharge prior to scheduled discharge tomorrow morning.  Alfonso Ramus, [place] [phone]

## 2020-05-27 NOTE — Progress Notes (Addendum)
Post Partum Day 1  Subjective: no complaints, up ad lib, voiding and tolerating PO  Doing well, no concerns. Ambulating without difficulty, pain managed with PO meds, tolerating regular diet, and voiding without difficulty.   No fever/chills, chest pain, shortness of breath, nausea/vomiting, or leg pain. No nipple or breast pain. No headache, visual changes, or RUQ/epigastric pain.  Objective: BP (!) 143/102   Pulse 65   Temp 98 F (36.7 C) (Oral)   Resp 16   Ht 5\' 3"  (1.6 m)   Wt 77.1 kg   SpO2 96%   Breastfeeding Unknown   BMI 30.11 kg/m    Physical Exam:  General: alert, cooperative and no distress Breasts: soft/nontender CV: RRR Pulm: nl effort, CTABL Abdomen: soft, non-tender, active bowel sounds Uterine Fundus: firm Perineum: minimal edema, intact Lochia: appropriate DVT Evaluation: No evidence of DVT seen on physical exam. Psych: flat affect   Recent Labs    05/26/20 1116 05/27/20 0633  HGB 8.9* 9.4*  HCT 28.3* 30.0*  WBC 14.0* 15.9*  PLT 267 273    Assessment/Plan: 24 y.o. 24 postpartum day # 1  -Continue routine postpartum care -Transition of care consult d/t limited PNC, flat affect, and concern for bonding.  Also unsure if she has custody of her other children.  She mentioned that her other children were not staying with her but no further information was provided.   -Lactation consult PRN for breastfeeding   -Mild range b/p's noted, no severe ranges.  Will start on Procardia 30mg  daily.   -Iron deficiency anemia - s/p 1 pRBC transfusion.  Improvement in Hgb to from 8.7->9.4.  Hemodynamically stable and asymptomatic; start PO ferrous sulfate BID with stool softeners  -Immunization status: Needs flu prior to discharge.  Varicella and Rubella status pending.  Disposition: Continue inpatient postpartum care    LOS: 2 days   [id number], 05/27/2020, 8:27 AM   ----- Ina Homes  Certified Nurse Midwife [facility]

## 2020-05-27 NOTE — Progress Notes (Signed)
RN walked into patient's room to return baby from having circumcision. Patient and FOB laying in bed arguing. Explained I was there to return baby and needed to check her BP. Pt refused at this time, will try again shortly.

## 2020-05-27 NOTE — Anesthesia Postprocedure Evaluation (Signed)
Anesthesia Post Note  Patient: Audrey Beard  Procedure(s) Performed: AN AD HOC LABOR EPIDURAL  Patient location during evaluation: Mother Baby Anesthesia Type: Epidural Level of consciousness: awake and alert and sedated Pain management: pain level controlled Vital Signs Assessment: post-procedure vital signs reviewed and stable Respiratory status: respiratory function stable Cardiovascular status: stable Postop Assessment: no headache, no backache, no apparent nausea or vomiting, able to ambulate, adequate PO intake and patient able to bend at knees Anesthetic complications: no   No complications documented.   Last Vitals:  Vitals:   05/26/20 2312 05/27/20 0330  BP: 134/79 131/84  Pulse: 86 80  Resp: 18 16  Temp: 37.2 C 36.7 C  SpO2: 99% 97%    Last Pain:  Vitals:   05/27/20 0330  TempSrc: Oral  PainSc:                  Zachary George

## 2020-05-28 ENCOUNTER — Other Ambulatory Visit: Payer: Self-pay | Admitting: Obstetrics and Gynecology

## 2020-05-28 LAB — CBC
HCT: 34.9 % — ABNORMAL LOW (ref 36.0–46.0)
Hemoglobin: 10.8 g/dL — ABNORMAL LOW (ref 12.0–15.0)
MCH: 24.4 pg — ABNORMAL LOW (ref 26.0–34.0)
MCHC: 30.9 g/dL (ref 30.0–36.0)
MCV: 79 fL — ABNORMAL LOW (ref 80.0–100.0)
Platelets: 364 10*3/uL (ref 150–400)
RBC: 4.42 MIL/uL (ref 3.87–5.11)
RDW: 18.3 % — ABNORMAL HIGH (ref 11.5–15.5)
WBC: 11.6 10*3/uL — ABNORMAL HIGH (ref 4.0–10.5)
nRBC: 0 % (ref 0.0–0.2)

## 2020-05-28 LAB — COMPREHENSIVE METABOLIC PANEL
ALT: 10 U/L (ref 0–44)
AST: 22 U/L (ref 15–41)
Albumin: 2.7 g/dL — ABNORMAL LOW (ref 3.5–5.0)
Alkaline Phosphatase: 155 U/L — ABNORMAL HIGH (ref 38–126)
Anion gap: 10 (ref 5–15)
BUN: 6 mg/dL (ref 6–20)
CO2: 21 mmol/L — ABNORMAL LOW (ref 22–32)
Calcium: 9 mg/dL (ref 8.9–10.3)
Chloride: 107 mmol/L (ref 98–111)
Creatinine, Ser: 0.55 mg/dL (ref 0.44–1.00)
GFR, Estimated: >60 mL/min (ref >60)
Glucose, Bld: 91 mg/dL (ref 70–99)
Potassium: 3.2 mmol/L — ABNORMAL LOW (ref 3.5–5.1)
Sodium: 138 mmol/L (ref 135–145)
Total Bilirubin: 0.9 mg/dL (ref 0.3–1.2)
Total Protein: 6.6 g/dL (ref 6.5–8.1)

## 2020-05-28 LAB — SURGICAL PATHOLOGY

## 2020-05-28 MED ORDER — DOCUSATE SODIUM 100 MG PO CAPS: 100.0000 mg | ORAL_CAPSULE | Freq: Two times a day (BID) | ORAL | 0 refills | Status: DC

## 2020-05-28 MED ORDER — MEASLES, MUMPS & RUBELLA VAC IJ SOLR
0.5000 mL | Freq: Once | INTRAMUSCULAR | Status: DC
  Filled 2020-05-28: qty 0.5

## 2020-05-28 MED ORDER — ACETAMINOPHEN 325 MG PO TABS: 650.0000 mg | ORAL_TABLET | ORAL | 0 refills | Status: DC | PRN

## 2020-05-28 MED ORDER — NIFEDIPINE ER 60 MG PO TB24: 60.0000 mg | ORAL_TABLET | Freq: Every day | ORAL | 0 refills | Status: DC

## 2020-05-28 MED ORDER — IBUPROFEN 600 MG PO TABS: 600.0000 mg | ORAL_TABLET | Freq: Four times a day (QID) | ORAL | 0 refills | Status: DC

## 2020-05-28 NOTE — Progress Notes (Signed)
FOB states that the pt and himself are to go to the hotel [address] in [place], [place]. Meagan Hagwood LCSW notified of what FOB states.

## 2020-05-28 NOTE — Progress Notes (Signed)
Discharge instructions, prescriptions, education, and appointments given and explained. Pt verbalized understanding with no further questions. Pt wheeled to personal vehicle with belongings and infant and FOB via staff.

## 2020-05-28 NOTE — Progress Notes (Signed)
Post Partum Day 2 Subjective: Doing well, no complaints.  Tolerating regular diet, pain with PO meds, voiding and ambulating without difficulty.  No CP SOB Fever,Chills, N/V or leg pain; denies nipple or breast pain, no HA change of vision, RUQ/epigastric pain   Objective: BP 125/70 (BP Location: Right Arm)   Pulse 76   Temp 98.4 F (36.9 C) (Oral)   Resp 18   Ht '5\' 3"'  (1.6 m)   Wt 77.1 kg   SpO2 98% Comment: Room Air  Breastfeeding Unknown   BMI 30.11 kg/m    Physical Exam:  General: NAD, no eye contact and very flat affect. Limited responses.  Breasts: declined per pt, reports no concerns.  CV: RRR Pulm: nl effort  Abdomen: soft, NT Perineum: minimal edema, intact Lochia: small Uterine Fundus: fundus firm and 1 fb below umbilicus DVT Evaluation: no cords, ttp LEs   Recent Labs    05/26/20 1116 05/27/20 0633  HGB 8.9* 9.4*  HCT 28.3* 30.0*  WBC 14.0* 15.9*  PLT 267 273    Assessment/Plan: 24 y.o. [id number] postpartum day # 2  - Continue routine PP care - encouraged snug fitting bra and cabbage leaves for bottlefeeding.  - BP mild to normal overnight, Procardia 6m XL ordered for AM dose today, repeat Pre-e labs ordered this am, [facility] for DC meds. Plan DC this afternoon if stable BP and labs[facility]  - Discussed contraceptive options including implant, IUDs hormonal and non-hormonal, injection, pills/ring/patch, condoms, and NFP. Pt desires Depo, wants to wait and receive injection at clinic- [facility]  - Chronic iron deficiency anemia - hemodynamically stable and asymptomatic, elevated WBCs- will repeat CBC now.  - hypokalemia: given PO K+ replacement, will repeat CMP this am.  - Immunization status: Needs MMR prior to DC - pending CPS and social work assessments prior to infant discharge.    Disposition: Does desire Dc home today.     RFrancetta Found  CNM 05/28/2020  8:50 AM

## 2020-05-28 NOTE — Consult Note (Addendum)
Brief Pharmacy Note  Addendum: Copay collected and Meds delivered to bedside   Pharmacy has been consulted for our anti-hypertensive meds to beds program.   Patient has agreed to the program, and is aware she will be responsible for her regular outpatient prescription copay.   The patient's preferred pharmacy in Epic has been changed to [facility] employee pharmacy (so the electronic prescriptions can be sent there instead of their prior designated pharmacy.)    The electronic rx must be sent to employee pharmacy by 4:30pm the day of discharge for the meds to beds delivery to be possible.  Thank you for including pharmacy in this patient's care.  Albina Billet, Clinical Pharmacist 05/28/2020 9:32 AM
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
## 2020-05-28 NOTE — Discharge Instructions (Signed)

## 2020-09-02 ENCOUNTER — Encounter: Payer: Medicaid Other | Admitting: Certified Nurse Midwife

## 2020-12-25 IMAGING — US US OB COMP +14 WK
1 series · 13 of 28 positions shown · non-contrast
Comparison: none

CLINICAL DATA: Assess fetal anatomy

EXAM:
OBSTETRICAL ULTRASOUND >14 WKS

[Series 1: ob us · 13 of 110 slices shown]
[im 5/110]
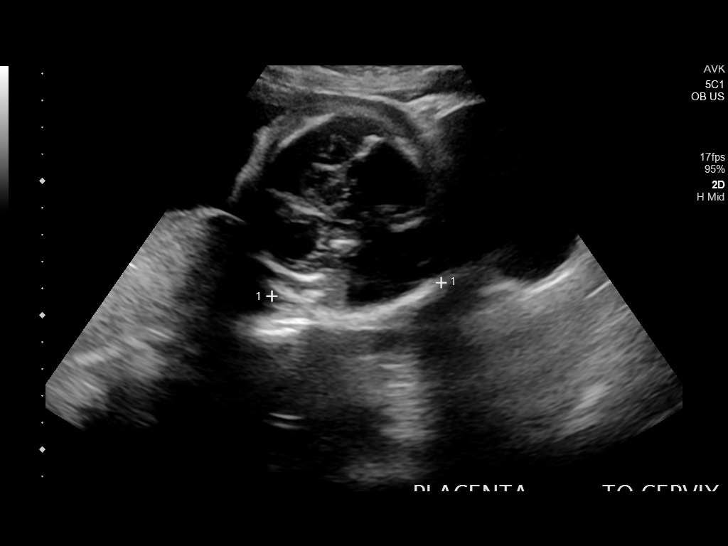
[im 13/110]
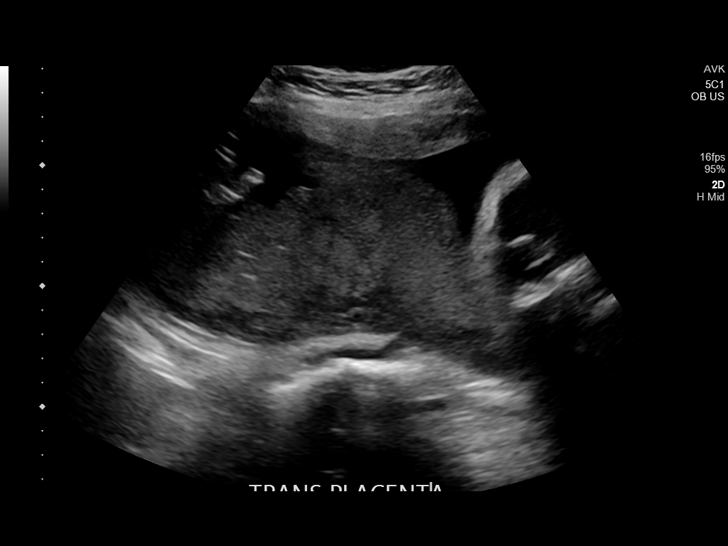
[im 21/110]
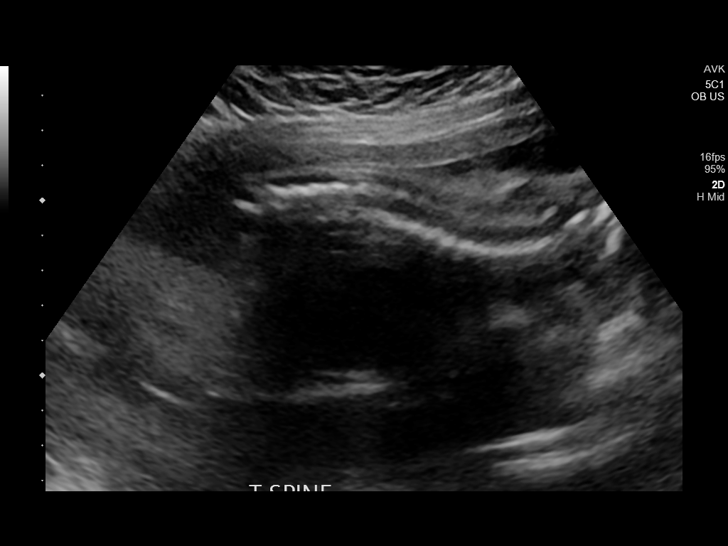
[im 29/110]
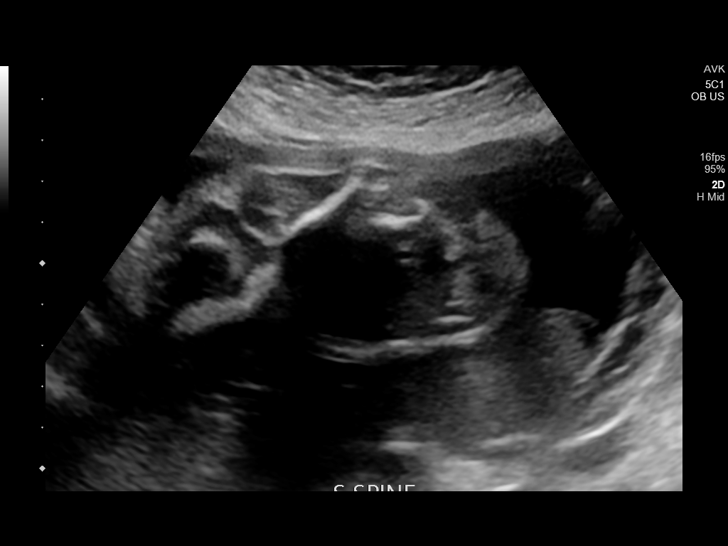
[im 37/110]
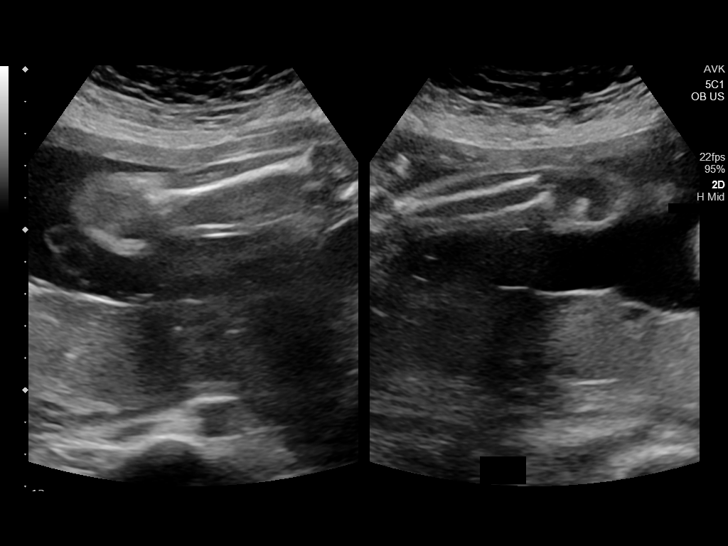
[im 45/110]
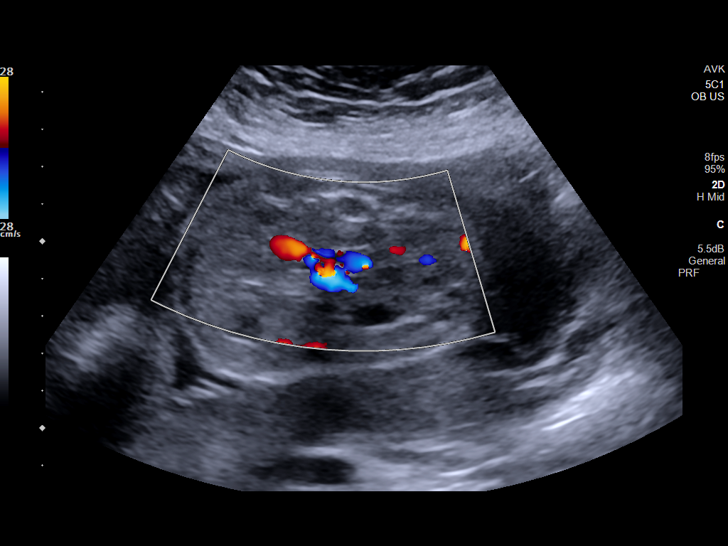
[im 57/110]
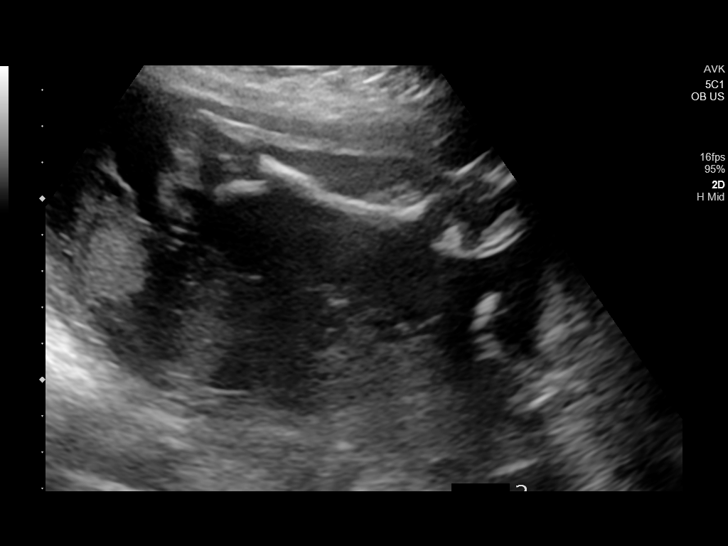
[im 65/110]
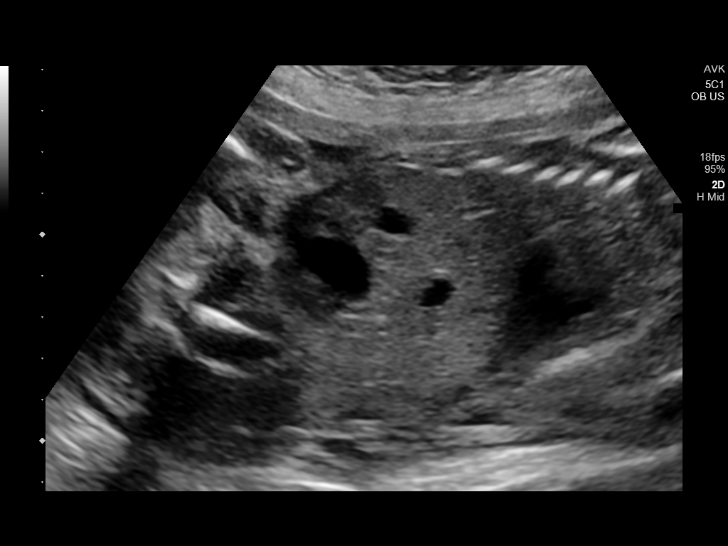
[im 73/110]
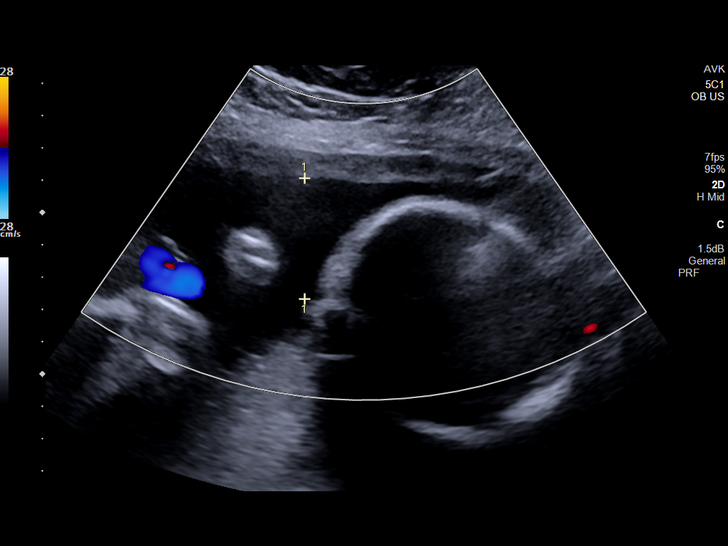
[im 81/110]
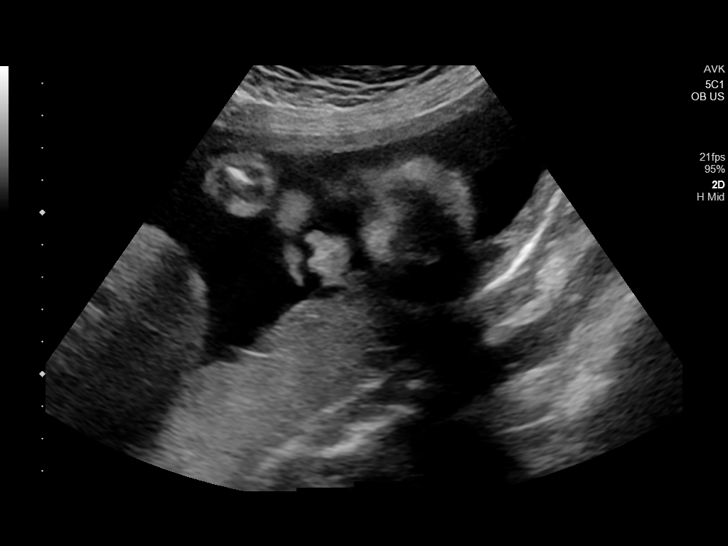
[im 89/110]
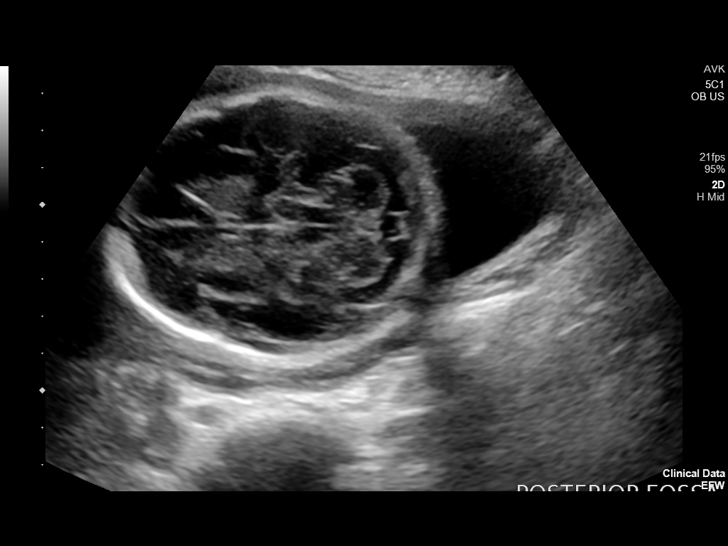
[im 97/110]
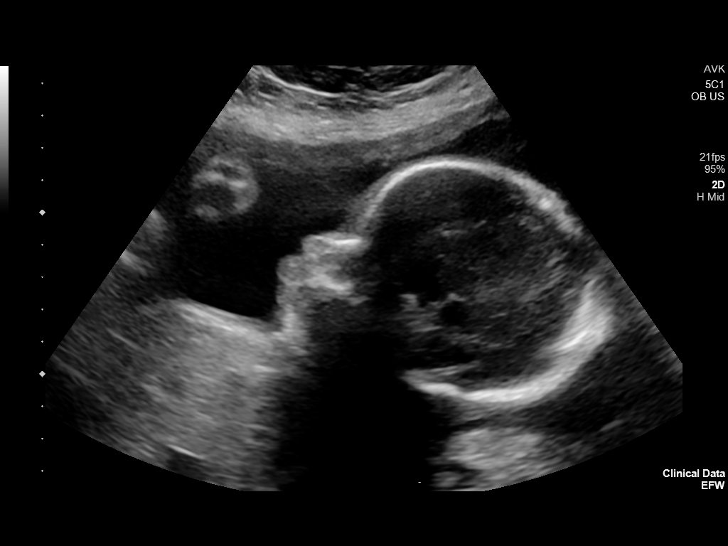
[im 105/110]
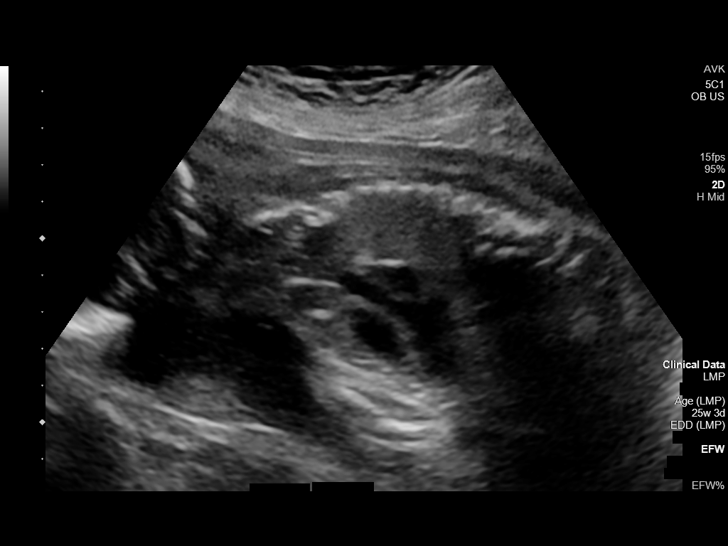

[13 of 28 positions shown; findings below may reference images not displayed]

FINDINGS: Number of Fetuses: 1

Heart Rate:  140 bpm

Movement: Yes

Presentation: Cephalic

Previa: No

Placental Location: Posterior

Amniotic Fluid (Subjective): Normal

Amniotic Fluid (Objective):

AFI = 14.2 cm (5%ile= 9.4 cm, 95%= 22.8 cm for 28 wks)

FETAL BIOMETRY

BPD: 7.1cm 28w 4d

HC:   26.2cm 28w 3d

AC:   23.1cm 27w 3d

FL:   5.4cm 28w 5d

Current Mean GA: 28w 3d US EDC: 06/11/2020

Assigned GA:  25w 3d Assigned EDC: 07/02/2020

Estimated Fetal Weight:  1,167g greater than 99%ile

FETAL ANATOMY

Lateral Ventricles: Appears normal

Thalami/CSP: Appears normal

Posterior Fossa:  Appears normal

Nuchal Region: Not well visualized   NFT= N/A > 20 WKS

Upper Lip: Appears normal

Spine: Appears normal

4 Chamber Heart on Left: Not well visualized

LVOT: Not visualized

RVOT: Not visualized

Stomach on Left: Appears normal

3 Vessel Cord: Appears normal

Cord Insertion site: Appears normal

Kidneys: Appears normal

Bladder: Appears normal

Extremities: Appears normal

Sex: Male

Technically difficult due to: Fetal position.

Maternal Findings:

Cervix:  4.8 cm
IMPRESSION: 1. Single live intrauterine pregnancy as above estimated age 28
weeks and 3 days.
2. Suboptimal visualization of the nuchal region, four-chamber
heart, and ventricular outflow tracts. Short interval follow-up
ultrasound could be performed to assess these structures. The
remainder of the anatomic survey was unremarkable.

## 2021-02-28 IMAGING — US US OB FOLLOW-UP
1 series · 13 of 28 positions shown · non-contrast
Comparison: none

CLINICAL DATA: Preeclampsia

EXAM:
OBSTETRIC 14+ WK ULTRASOUND FOLLOW-UP

[Series 1: us ob follow up · 13 of 34 slices shown]
[im 2/34]
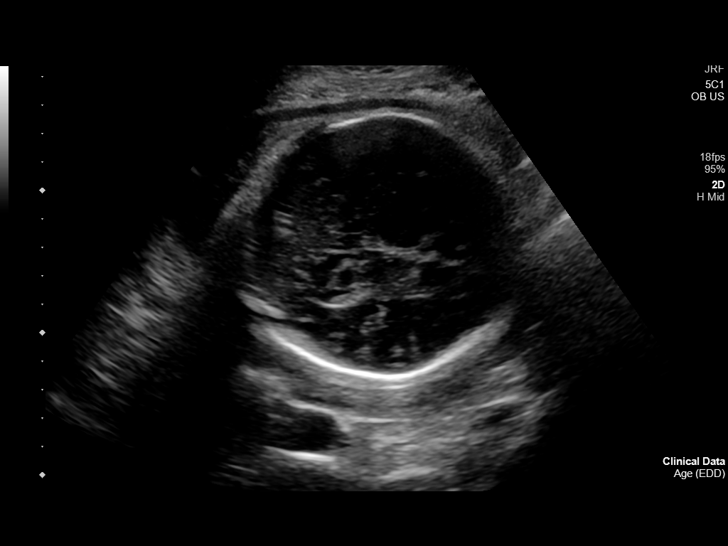
[im 4/34]
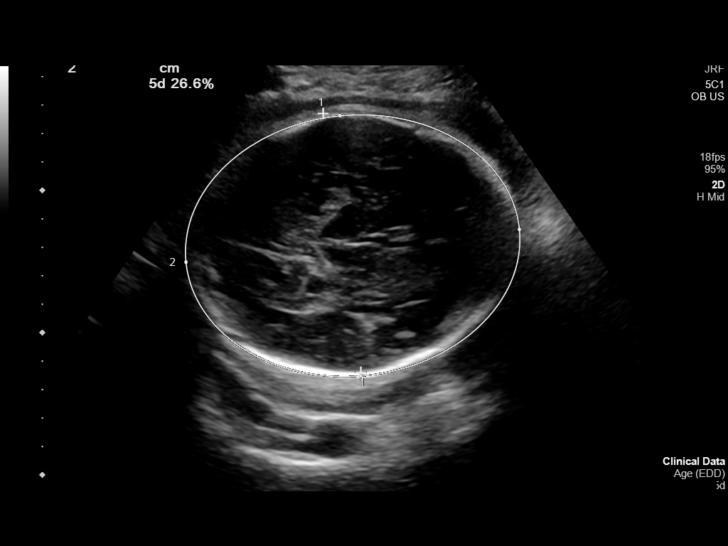
[im 7/34]
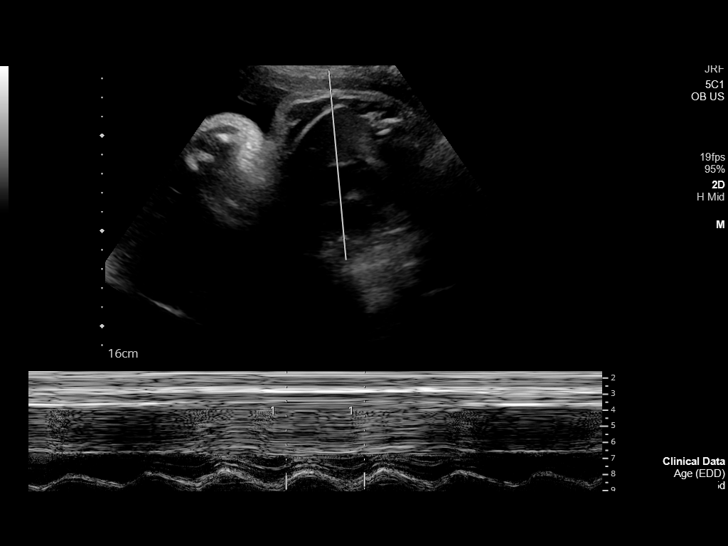
[im 9/34]
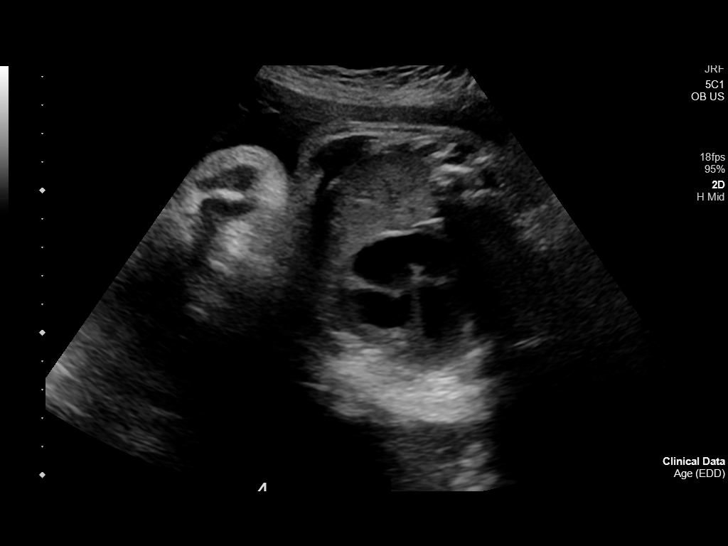
[im 12/34]
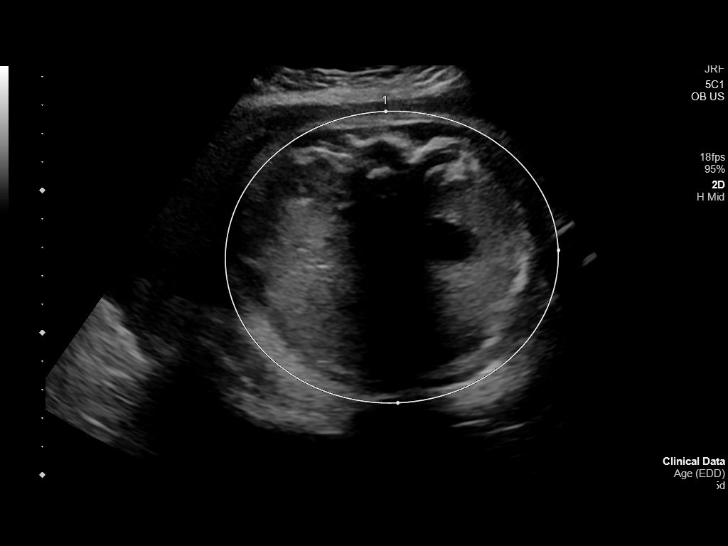
[im 14/34]
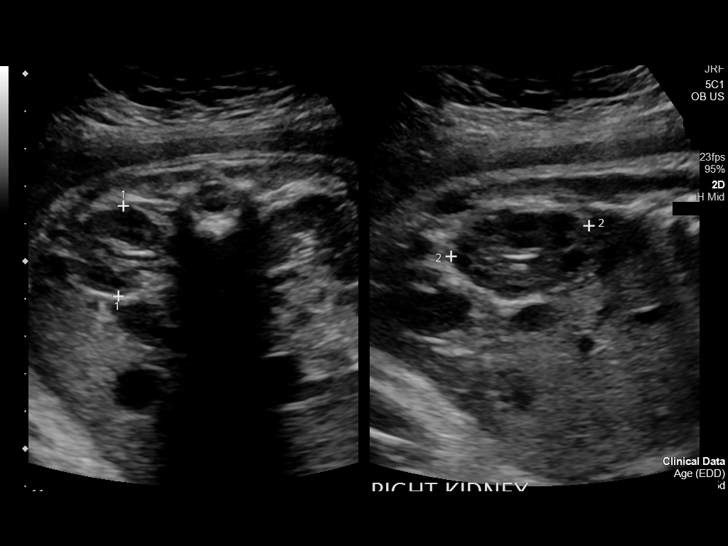
[im 18/34]
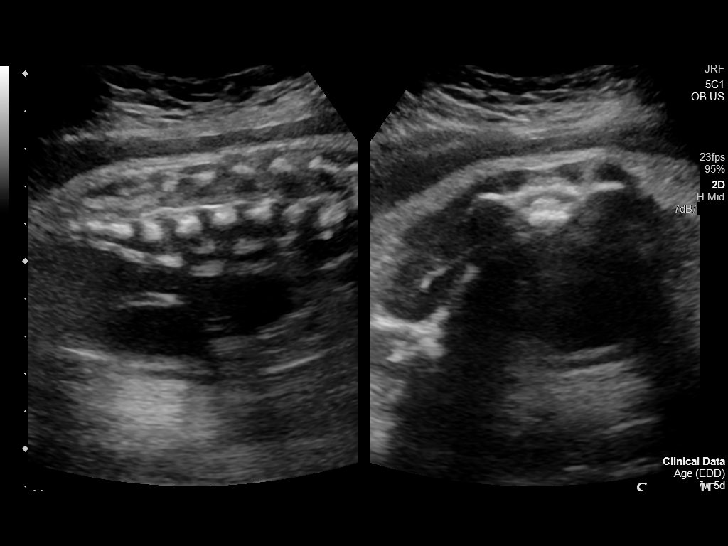
[im 20/34]
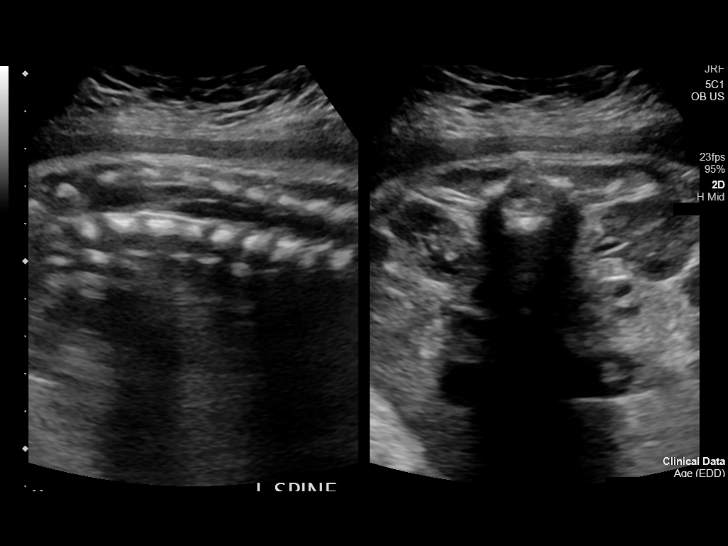
[im 23/34]
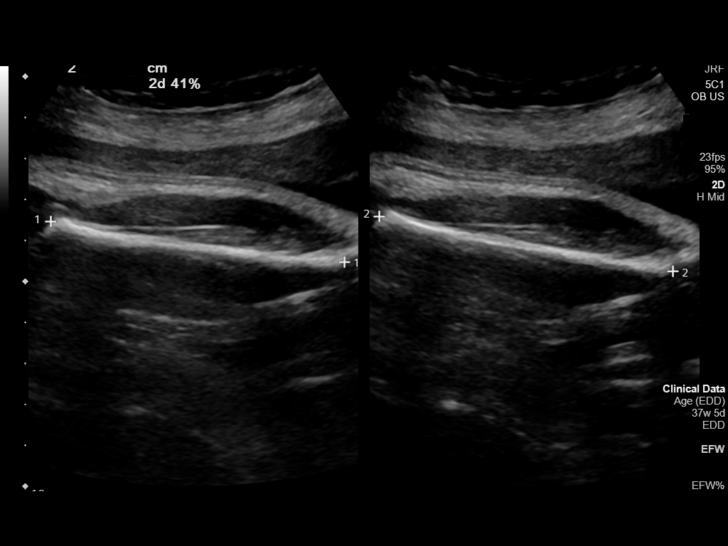
[im 25/34]
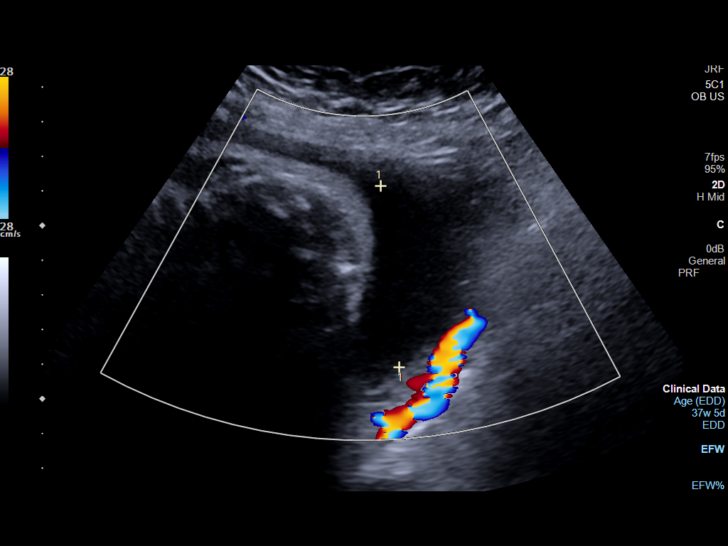
[im 27/34]
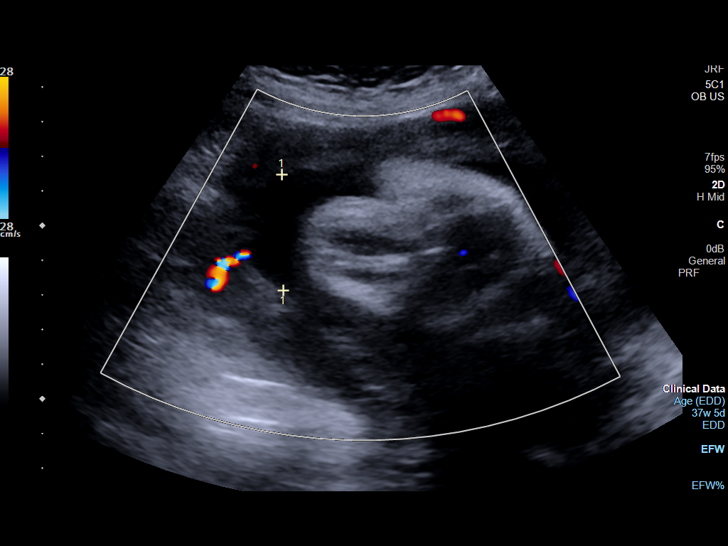
[im 30/34]
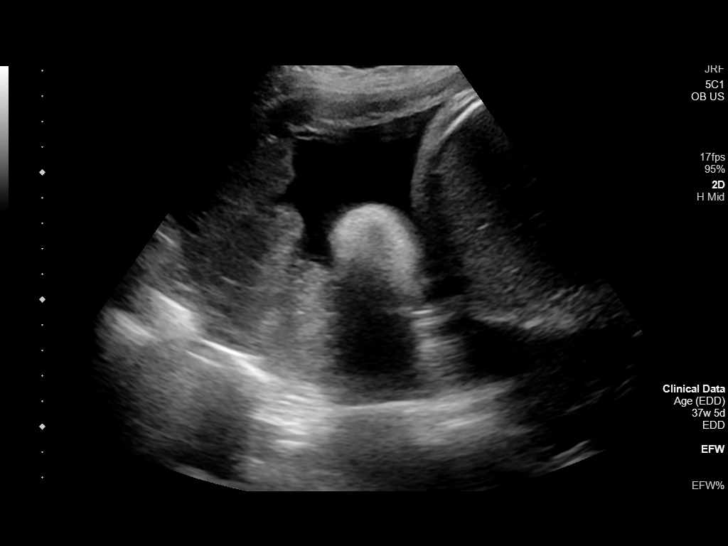
[im 32/34]
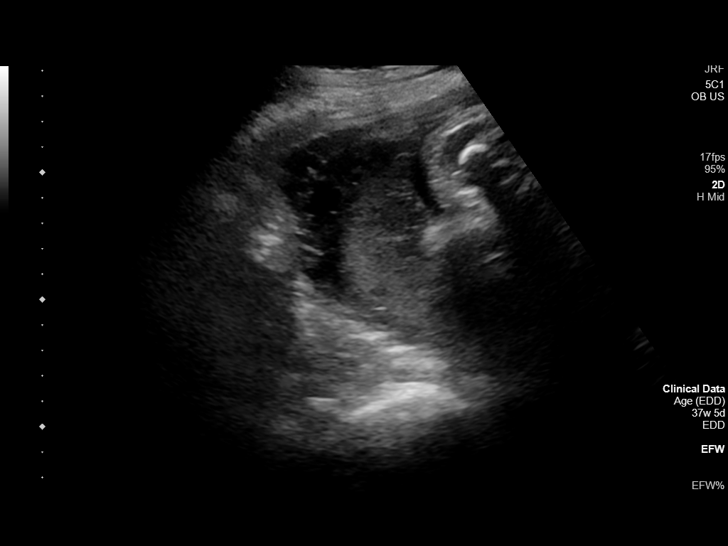

[13 of 28 positions shown; findings below may reference images not displayed]

FINDINGS: Number of Fetuses: 1

Heart Rate:  131 bpm

Movement: Yes

Presentation: Cephalic

Previa: No

Placental Location: Posterior

Amniotic Fluid (Subjective): Normal

Amniotic Fluid (Objective):

AFI 16.9 cm (5%ile= 7.5 cm, 95%= 24.4 cm for 37 wks)

FETAL BIOMETRY

BPD:  9.2cm 37w 2d

HC:    33.0cm 37w 4d

AC:    34.6cm 38w 4d

FL:    7.3cm 37w 1d

Current Mean GA: 37w 3d US EDC: 06/13/2020

Assigned GA: 37w 5d Assigned EDC: 06/11/2020

Estimated Fetal Weight:  3,354g 66%ile

FETAL ANATOMY

Lateral Ventricles: Previously seen

Thalami/CSP: Previously seen

Posterior Fossa: Previously seen

Nuchal Region: Not visualized

Upper Lip: Previously seen

Spine: Previously seen

4 Chamber Heart on Left: Appears normal

LVOT: Not visualized

RVOT: Not visualized

Stomach on Left: Previously seen

3 Vessel Cord: Previously seen

Cord Insertion site: Previously seen

Kidneys: Previously seen

Bladder: Previously seen

Extremities: Previously seen

Sex: Previously Seen

Technical Limitations: Advanced gestational age

Maternal Findings:

Cervix:  Obscured.
IMPRESSION: 1. Single live intrauterine gestation in cephalic presentation.
Gestational age of 37 weeks 5 days based on initial ultrasound.
2. Since the previous study, there has been adequate interval
growth.
3. Estimated fetal weight is in the 66th percentile.
4. Amniotic fluid index of 16.9 cm, within normal limits.
5. Limited follow-up fetal anatomic survey secondary to advanced
gestational age, as above.

## 2021-12-12 ENCOUNTER — Other Ambulatory Visit: Payer: Self-pay

## 2021-12-12 ENCOUNTER — Inpatient Hospital Stay
Admission: EM | Admit: 2021-12-12 | Discharge: 2021-12-14 | DRG: 872 | Disposition: A | Discharge status: Home / Self Care | Payer: Medicaid Other | Attending: Osteopathic Medicine | Admitting: Osteopathic Medicine

## 2021-12-12 ENCOUNTER — Emergency Department: Payer: Medicaid Other

## 2021-12-12 DIAGNOSIS — R6520 Severe sepsis without septic shock: Secondary | ICD-10-CM | POA: Diagnosis present

## 2021-12-12 DIAGNOSIS — K529 Noninfective gastroenteritis and colitis, unspecified: Secondary | ICD-10-CM

## 2021-12-12 DIAGNOSIS — F32A Depression, unspecified: Secondary | ICD-10-CM | POA: Diagnosis present

## 2021-12-12 DIAGNOSIS — N179 Acute kidney failure, unspecified: Secondary | ICD-10-CM

## 2021-12-12 DIAGNOSIS — D649 Anemia, unspecified: Secondary | ICD-10-CM | POA: Diagnosis present

## 2021-12-12 DIAGNOSIS — K56609 Unspecified intestinal obstruction, unspecified as to partial versus complete obstruction: Secondary | ICD-10-CM

## 2021-12-12 DIAGNOSIS — A09 Infectious gastroenteritis and colitis, unspecified: Secondary | ICD-10-CM | POA: Diagnosis present

## 2021-12-12 DIAGNOSIS — A419 Sepsis, unspecified organism: Principal | ICD-10-CM

## 2021-12-12 DIAGNOSIS — Z79899 Other long term (current) drug therapy: Secondary | ICD-10-CM | POA: Diagnosis not present

## 2021-12-12 DIAGNOSIS — E876 Hypokalemia: Secondary | ICD-10-CM | POA: Diagnosis present

## 2021-12-12 DIAGNOSIS — R8290 Unspecified abnormal findings in urine: Secondary | ICD-10-CM | POA: Diagnosis not present

## 2021-12-12 DIAGNOSIS — F17210 Nicotine dependence, cigarettes, uncomplicated: Secondary | ICD-10-CM | POA: Diagnosis present

## 2021-12-12 DIAGNOSIS — R1033 Periumbilical pain: Secondary | ICD-10-CM

## 2021-12-12 HISTORY — DX: Sepsis, unspecified organism: A41.9

## 2021-12-12 LAB — CBC
HCT: 35.7 % — ABNORMAL LOW (ref 36.0–46.0)
HCT: 31.5 % — ABNORMAL LOW (ref 36.0–46.0)
Hemoglobin: 10.7 g/dL — ABNORMAL LOW (ref 12.0–15.0)
Hemoglobin: 9.2 g/dL — ABNORMAL LOW (ref 12.0–15.0)
MCH: 21.6 pg — ABNORMAL LOW (ref 26.0–34.0)
MCH: 21.7 pg — ABNORMAL LOW (ref 26.0–34.0)
MCHC: 30 g/dL (ref 30.0–36.0)
MCHC: 29.2 g/dL — ABNORMAL LOW (ref 30.0–36.0)
MCV: 72 fL — ABNORMAL LOW (ref 80.0–100.0)
MCV: 74.5 fL — ABNORMAL LOW (ref 80.0–100.0)
Platelets: 468 10*3/uL — ABNORMAL HIGH (ref 150–400)
Platelets: 382 10*3/uL (ref 150–400)
RBC: 4.96 MIL/uL (ref 3.87–5.11)
RBC: 4.23 MIL/uL (ref 3.87–5.11)
RDW: 19.9 % — ABNORMAL HIGH (ref 11.5–15.5)
RDW: 20.1 % — ABNORMAL HIGH (ref 11.5–15.5)
WBC: 30.2 10*3/uL — ABNORMAL HIGH (ref 4.0–10.5)
WBC: 28.5 10*3/uL — ABNORMAL HIGH (ref 4.0–10.5)
nRBC: 0 % (ref 0.0–0.2)
nRBC: 0 % (ref 0.0–0.2)

## 2021-12-12 LAB — COMPREHENSIVE METABOLIC PANEL
ALT: 16 U/L (ref 0–44)
ALT: 14 U/L (ref 0–44)
AST: 21 U/L (ref 15–41)
AST: 17 U/L (ref 15–41)
Albumin: 4.3 g/dL (ref 3.5–5.0)
Albumin: 3.7 g/dL (ref 3.5–5.0)
Alkaline Phosphatase: 104 U/L (ref 38–126)
Alkaline Phosphatase: 85 U/L (ref 38–126)
Anion gap: 17 — ABNORMAL HIGH (ref 5–15)
Anion gap: 9 (ref 5–15)
BUN: 38 mg/dL — ABNORMAL HIGH (ref 6–20)
BUN: 37 mg/dL — ABNORMAL HIGH (ref 6–20)
CO2: 18 mmol/L — ABNORMAL LOW (ref 22–32)
CO2: 21 mmol/L — ABNORMAL LOW (ref 22–32)
Calcium: 9.3 mg/dL (ref 8.9–10.3)
Calcium: 8.3 mg/dL — ABNORMAL LOW (ref 8.9–10.3)
Chloride: 102 mmol/L (ref 98–111)
Chloride: 108 mmol/L (ref 98–111)
Creatinine, Ser: 2.99 mg/dL — ABNORMAL HIGH (ref 0.44–1.00)
Creatinine, Ser: 1.84 mg/dL — ABNORMAL HIGH (ref 0.44–1.00)
GFR, Estimated: 22 mL/min — ABNORMAL LOW (ref >60)
GFR, Estimated: 39 mL/min — ABNORMAL LOW (ref >60)
Glucose, Bld: 119 mg/dL — ABNORMAL HIGH (ref 70–99)
Glucose, Bld: 104 mg/dL — ABNORMAL HIGH (ref 70–99)
Potassium: 3 mmol/L — ABNORMAL LOW (ref 3.5–5.1)
Potassium: 2.8 mmol/L — ABNORMAL LOW (ref 3.5–5.1)
Sodium: 137 mmol/L (ref 135–145)
Sodium: 138 mmol/L (ref 135–145)
Total Bilirubin: 1.5 mg/dL — ABNORMAL HIGH (ref 0.3–1.2)
Total Bilirubin: 1.2 mg/dL (ref 0.3–1.2)
Total Protein: 9.6 g/dL — ABNORMAL HIGH (ref 6.5–8.1)
Total Protein: 8.2 g/dL — ABNORMAL HIGH (ref 6.5–8.1)

## 2021-12-12 LAB — BASIC METABOLIC PANEL
Anion gap: 10 (ref 5–15)
BUN: 32 mg/dL — ABNORMAL HIGH (ref 6–20)
CO2: 16 mmol/L — ABNORMAL LOW (ref 22–32)
Calcium: 8 mg/dL — ABNORMAL LOW (ref 8.9–10.3)
Chloride: 109 mmol/L (ref 98–111)
Creatinine, Ser: 1.4 mg/dL — ABNORMAL HIGH (ref 0.44–1.00)
GFR, Estimated: 54 mL/min — ABNORMAL LOW (ref >60)
Glucose, Bld: 84 mg/dL (ref 70–99)
Potassium: 3.5 mmol/L (ref 3.5–5.1)
Sodium: 135 mmol/L (ref 135–145)

## 2021-12-12 LAB — HCG, QUANTITATIVE, PREGNANCY: hCG, Beta Chain, Quant, S: 1 m[IU]/mL (ref <5)

## 2021-12-12 LAB — PROCALCITONIN: Procalcitonin: 22.93 ng/mL

## 2021-12-12 LAB — BLOOD GAS, VENOUS
Acid-base deficit: 4.8 mmol/L — ABNORMAL HIGH (ref 0.0–2.0)
Bicarbonate: 21.6 mmol/L (ref 20.0–28.0)
O2 Saturation: 50.8 %
Patient temperature: 37
pCO2, Ven: 44 mmHg (ref 44–60)
pH, Ven: 7.3 (ref 7.25–7.43)
pO2, Ven: 37 mmHg (ref 32–45)

## 2021-12-12 LAB — URINALYSIS, ROUTINE W REFLEX MICROSCOPIC
Bilirubin Urine: NEGATIVE
Glucose, UA: NEGATIVE mg/dL
Ketones, ur: NEGATIVE mg/dL
Nitrite: NEGATIVE
Protein, ur: 30 mg/dL — AB
Specific Gravity, Urine: 1.018 (ref 1.005–1.030)
pH: 5 (ref 5.0–8.0)

## 2021-12-12 LAB — C-REACTIVE PROTEIN: CRP: 28.5 mg/dL — ABNORMAL HIGH (ref <1.0)

## 2021-12-12 LAB — ETHANOL: Alcohol, Ethyl (B): <10 mg/dL (ref <10)

## 2021-12-12 LAB — URINE DRUG SCREEN, QUALITATIVE (ARMC ONLY)
Amphetamines, Ur Screen: NOT DETECTED
Barbiturates, Ur Screen: NOT DETECTED
Benzodiazepine, Ur Scrn: NOT DETECTED
Cannabinoid 50 Ng, Ur ~~LOC~~: POSITIVE — AB
Cocaine Metabolite,Ur ~~LOC~~: NOT DETECTED
MDMA (Ecstasy)Ur Screen: NOT DETECTED
Methadone Scn, Ur: NOT DETECTED
Opiate, Ur Screen: POSITIVE — AB
Phencyclidine (PCP) Ur S: NOT DETECTED
Tricyclic, Ur Screen: NOT DETECTED

## 2021-12-12 LAB — LACTIC ACID, PLASMA
Lactic Acid, Venous: 1.2 mmol/L (ref 0.5–1.9)
Lactic Acid, Venous: 1.1 mmol/L (ref 0.5–1.9)

## 2021-12-12 LAB — POC URINE PREG, ED: Preg Test, Ur: NEGATIVE

## 2021-12-12 LAB — LIPASE, BLOOD: Lipase: 34 U/L (ref 11–51)

## 2021-12-12 LAB — PHOSPHORUS: Phosphorus: 3.9 mg/dL (ref 2.5–4.6)

## 2021-12-12 LAB — MAGNESIUM: Magnesium: 2.1 mg/dL (ref 1.7–2.4)

## 2021-12-12 LAB — HIV ANTIBODY (ROUTINE TESTING W REFLEX): HIV Screen 4th Generation wRfx: NONREACTIVE

## 2021-12-12 MED ORDER — SODIUM CHLORIDE 0.9 % IV SOLN
1000.0000 mL | Freq: Once | INTRAVENOUS | Status: AC
  Administered 2021-12-12: 1000 mL via INTRAVENOUS

## 2021-12-12 MED ORDER — POTASSIUM CHLORIDE 10 MEQ/100ML IV SOLN
10.0000 meq | INTRAVENOUS | Status: AC
  Administered 2021-12-12 (×3): 10 meq via INTRAVENOUS
  Filled 2021-12-12 (×3): qty 100

## 2021-12-12 MED ORDER — ONDANSETRON HCL 4 MG PO TABS: 4.0000 mg | ORAL_TABLET | Freq: Four times a day (QID) | ORAL | Status: DC | PRN

## 2021-12-12 MED ORDER — SODIUM CHLORIDE 0.9 % IV SOLN
1.0000 g | Freq: Once | INTRAVENOUS | Status: AC
  Administered 2021-12-12: 1 g via INTRAVENOUS
  Filled 2021-12-12: qty 10

## 2021-12-12 MED ORDER — MORPHINE SULFATE (PF) 2 MG/ML IV SOLN
2.0000 mg | Freq: Once | INTRAVENOUS | Status: AC
  Administered 2021-12-12: 2 mg via INTRAVENOUS
  Filled 2021-12-12: qty 1

## 2021-12-12 MED ORDER — ONDANSETRON HCL 4 MG/2ML IJ SOLN
4.0000 mg | Freq: Once | INTRAMUSCULAR | Status: AC | PRN
  Administered 2021-12-12: 4 mg via INTRAVENOUS
  Filled 2021-12-12: qty 2

## 2021-12-12 MED ORDER — ACETAMINOPHEN 10 MG/ML IV SOLN
1000.0000 mg | Freq: Three times a day (TID) | INTRAVENOUS | Status: AC | PRN
  Filled 2021-12-12: qty 100

## 2021-12-12 MED ORDER — MORPHINE SULFATE (PF) 4 MG/ML IV SOLN: 4.0000 mg | Freq: Once | INTRAVENOUS | Status: DC

## 2021-12-12 MED ORDER — HEPARIN SODIUM (PORCINE) 5000 UNIT/ML IJ SOLN: 5000.0000 [IU] | Freq: Three times a day (TID) | INTRAMUSCULAR | Status: DC

## 2021-12-12 MED ORDER — PIPERACILLIN-TAZOBACTAM 3.375 G IVPB 30 MIN: 3.3750 g | Freq: Once | INTRAVENOUS | Status: DC

## 2021-12-12 MED ORDER — METRONIDAZOLE 500 MG/100ML IV SOLN
500.0000 mg | Freq: Once | INTRAVENOUS | Status: AC
  Administered 2021-12-12: 500 mg via INTRAVENOUS
  Filled 2021-12-12: qty 100

## 2021-12-12 MED ORDER — SODIUM CHLORIDE 0.9% FLUSH
3.0000 mL | Freq: Two times a day (BID) | INTRAVENOUS | Status: DC
  Administered 2021-12-13: 3 mL via INTRAVENOUS

## 2021-12-12 MED ORDER — SODIUM CHLORIDE 0.45 % IV SOLN: INTRAVENOUS | Status: DC

## 2021-12-12 MED ORDER — MORPHINE SULFATE (PF) 4 MG/ML IV SOLN
4.0000 mg | Freq: Once | INTRAVENOUS | Status: AC
  Administered 2021-12-12: 4 mg via INTRAVENOUS
  Filled 2021-12-12: qty 1

## 2021-12-12 MED ORDER — HYDROMORPHONE HCL 1 MG/ML IJ SOLN
0.5000 mg | INTRAMUSCULAR | Status: DC | PRN
  Administered 2021-12-12: 1 mg via INTRAVENOUS
  Filled 2021-12-12: qty 1

## 2021-12-12 MED ORDER — ONDANSETRON HCL 4 MG/2ML IJ SOLN
4.0000 mg | Freq: Four times a day (QID) | INTRAMUSCULAR | Status: DC | PRN
  Administered 2021-12-12: 4 mg via INTRAVENOUS
  Filled 2021-12-12: qty 2

## 2021-12-12 NOTE — ED Triage Notes (Signed)
Pt states that she hasn't had an appetite with vomiting and unable to keep anything down. Pt reports last menstrual was not normal ?

## 2021-12-12 NOTE — H&P (Addendum)
?History and Physical  ? ? Audrey Beard EHM:[id number] DOB: January 20, 1996 DOA: 12/12/2021 ? ?PCP: [facility]  ?Patient coming from: Home ? ?I have personally briefly reviewed patient's old medical records in [facility] ? ?Chief Complaint:  abdominal pain, n/v/d x 3 days progressive  ? ?HPI: Audrey Beard is a 26 y.o. female with medical history significant of anemia , depression, who presents to ED with 3 days of abdominal pain n/v/d with progressive symptoms. Patient notes associated chills but no fever. She notes last episode of loose stools was this am. She denies any black stools or blood in stools. She denies any sick contacts or tainted foods. She does note prior to onset of symptoms she did ingest a moderate amount of alcohol.  She does currently comment that she does feel improved s/p treatment in ED ? ? ?ED Course:  ?Vitals: ?Afeb, bp 116/103, hr 141 sat 97% on ra  ?UA :+protein, few bacteria  ,+blood, rbc  ?Wbc:30, hgb 10.7 mcv 72, ?NA 137, K 3, bicarb 18, AG17 cr 2.99 ( base 0.5) ?EKG: sinus tachycardia  rated related st changes lateal leads ?CT abd/pelvis: ?IMPRESSION: ?Multiple thick-walled loops of distal small bowel with perienteric ?fat stranding and upstream dilation, findings are likely due to ?infectious or inflammatory enteritis with secondary small bowel ?obstruction. ?Tx: morphine, zofran, 1L NS,ctx ,flagyl ? ?Review of Systems: As per HPI otherwise 10 point review of systems negative.  ? ?Past Medical History:  ?Diagnosis Date  ? Anemia   ? Depression   ? Pregnancy induced hypertension   ? ? ?Past Surgical History:  ?Procedure Laterality Date  ? NO PAST SURGERIES    ? ? ? reports that she has been smoking cigarettes. She has a 1.00 pack-year smoking history. She has never used smokeless tobacco. She reports that she does not drink alcohol and does not use drugs. ? ?No Known Allergies ? ?No family history on file. ? ?Prior to Admission medications   ?Medication Sig  Start Date End Date Taking? Authorizing Provider  ?acetaminophen (TYLENOL) 325 MG tablet Take 2 tablets (650 mg total) by mouth every 4 (four) hours as needed (for pain scale < 4). 05/28/20   McVey, Prudencio Pair, CNM  ?docusate sodium (COLACE) 100 MG capsule Take 1 capsule (100 mg total) by mouth 2 (two) times daily. 05/28/20   McVey, Prudencio Pair, CNM  ?NIFEdipine (ADALAT CC) 60 MG 24 hr tablet Take 1 tablet (60 mg total) by mouth daily. 05/29/20   McVey, Prudencio Pair, CNM  ?NIFEdipine (PROCARDIA XL/NIFEDICAL XL) 60 MG 24 hr tablet TAKE 1 TABLET BY MOUTH DAILY 05/28/20 05/28/21  McVey, Prudencio Pair, CNM  ?Prenatal Vit-Fe Fumarate-FA (MULTIVITAMIN-PRENATAL) 27-0.8 MG TABS tablet Take 1 tablet by mouth daily at 12 noon.    [provider]  ? ? ?Physical Exam: ?Vitals:  ? 12/12/21 1218 12/12/21 1222 12/12/21 1300 12/12/21 1557  ?BP: (!) 116/103  111/81 119/64  ?Pulse: (!) 141  (!) 107 96  ?Resp: 16  11 15   ?Temp: 98.3 ?F (36.8 ?C)     ?TempSrc: Oral     ?SpO2: 97%  99% 100%  ?Weight:  83.4 kg    ?Height:  5\' 3"  (1.6 m)    ? ? ? ?Vitals:  ? 12/12/21 1218 12/12/21 1222 12/12/21 1300 12/12/21 1557  ?BP: (!) 116/103  111/81 119/64  ?Pulse: (!) 141  (!) 107 96  ?Resp: 16  11 15   ?Temp: 98.3 ?F (36.8 ?C)     ?  TempSrc: Oral     ?SpO2: 97%  99% 100%  ?Weight:  83.4 kg    ?Height:  5\' 3"  (1.6 m)    ?Constitutional: NAD, calm, comfortable ?Eyes: PERRL, lids and conjunctivae normal ?ENMT: Mucous membranes are moist. Posterior pharynx clear of any exudate or lesions.Normal dentition.  ?Neck: normal, supple, no masses, no thyromegaly ?Respiratory: clear to auscultation bilaterally, no wheezing, no crackles. Normal respiratory effort. No accessory muscle use.  ?Cardiovascular: Regular rate and rhythm, no murmurs / rubs / gallops. No extremity edema. 2+ pedal pulses.  ?Abdomen: hypoactive bs, soft not distended, mild tenderness lower abdomen no rebound, no masses palpated. No hepatosplenomegaly. Bowel sounds positive.   ?Musculoskeletal: no clubbing / cyanosis. No joint deformity upper and lower extremities. Good ROM, no contractures. Normal muscle tone.  ?Skin: no rashes, lesions, ulcers. No induration ?Neurologic: CN 2-12 grossly intact. Sensation intact, normal. Strength 5/5 in all 4.  ?Psychiatric: Normal judgment and insight. Alert and oriented x 3. Normal mood.  ? ? ?Labs on Admission: I have personally reviewed following labs and imaging studies ? ?CBC: ?Recent Labs  ?Lab 12/12/21 ?1232  ?WBC 30.2*  ?HGB 10.7*  ?HCT 35.7*  ?MCV 72.0*  ?PLT 468*  ? ?Basic Metabolic Panel: ?Recent Labs  ?Lab 12/12/21 ?1232  ?NA 137  ?K 3.0*  ?CL 102  ?CO2 18*  ?GLUCOSE 119*  ?BUN 38*  ?CREATININE 2.99*  ?CALCIUM 9.3  ? ?GFR: ?Estimated Creatinine Clearance: 29.4 mL/min (A) (by C-G formula based on SCr of 2.99 mg/dL (H)). ?Liver Function Tests: ?Recent Labs  ?Lab 12/12/21 ?1232  ?AST 21  ?ALT 16  ?ALKPHOS 104  ?BILITOT 1.5*  ?PROT 9.6*  ?ALBUMIN 4.3  ? ?Recent Labs  ?Lab 12/12/21 ?1232  ?LIPASE 34  ? ?No results for input(s): AMMONIA in the last 168 hours. ?Coagulation Profile: ?No results for input(s): INR, PROTIME in the last 168 hours. ?Cardiac Enzymes: ?No results for input(s): CKTOTAL, CKMB, CKMBINDEX, TROPONINI in the last 168 hours. ?BNP (last 3 results) ?No results for input(s): PROBNP in the last 8760 hours. ?HbA1C: ?No results for input(s): HGBA1C in the last 72 hours. ?CBG: ?No results for input(s): GLUCAP in the last 168 hours. ?Lipid Profile: ?No results for input(s): CHOL, HDL, LDLCALC, TRIG, CHOLHDL, LDLDIRECT in the last 72 hours. ?Thyroid Function Tests: ?No results for input(s): TSH, T4TOTAL, FREET4, T3FREE, THYROIDAB in the last 72 hours. ?Anemia Panel: ?No results for input(s): VITAMINB12, FOLATE, FERRITIN, TIBC, IRON, RETICCTPCT in the last 72 hours. ?Urine analysis: ?   ?Component Value Date/Time  ? COLORURINE AMBER (A) 12/12/2021 1224  ? APPEARANCEUR CLOUDY (A) 12/12/2021 1224  ? APPEARANCEUR Hazy 09/04/2014 2030  ?  LABSPEC 1.018 12/12/2021 1224  ? LABSPEC 1.018 09/04/2014 2030  ? PHURINE 5.0 12/12/2021 1224  ? GLUCOSEU NEGATIVE 12/12/2021 1224  ? GLUCOSEU Negative 09/04/2014 2030  ? HGBUR LARGE (A) 12/12/2021 1224  ? BILIRUBINUR NEGATIVE 12/12/2021 1224  ? BILIRUBINUR Negative 09/04/2014 2030  ? KETONESUR NEGATIVE 12/12/2021 1224  ? PROTEINUR 30 (A) 12/12/2021 1224  ? NITRITE NEGATIVE 12/12/2021 1224  ? LEUKOCYTESUR SMALL (A) 12/12/2021 1224  ? LEUKOCYTESUR 1+ 09/04/2014 2030  ? ? ?Radiological Exams on Admission: ?CT ABDOMEN PELVIS WO CONTRAST ? ?Result Date: 12/12/2021 ?CLINICAL DATA:  Abdominal pain EXAM: CT ABDOMEN AND PELVIS WITHOUT CONTRAST TECHNIQUE: Multidetector CT imaging of the abdomen and pelvis was performed following the standard protocol without IV contrast. RADIATION DOSE REDUCTION: This exam was performed according to the departmental dose-optimization program which includes automated exposure  control, adjustment of the mA and/or kV according to patient size and/or use of iterative reconstruction technique. COMPARISON:  CT abdomen pelvis dated September 01, 2010 FINDINGS: Lower chest: No acute abnormality. Hepatobiliary: No focal liver lesions. Cholelithiasis with no gallbladder wall thickening. No biliary ductal dilation. Pancreas: Unremarkable. No pancreatic ductal dilatation or surrounding inflammatory changes. Spleen: Normal in size without focal abnormality. Adrenals/Urinary Tract: Adrenal glands are unremarkable. Kidneys are normal, without renal calculi, focal lesion, or hydronephrosis. Bladder is unremarkable. Stomach/Bowel: Wall thickening of multiple distal small bowel loops with perienteric fat stranding, and upstream dilation. A transition point is seen in the right lower quadrant on series 2, image 45. Stomach is unremarkable. Normal appendix. Colon is decompressed. Vascular/Lymphatic: No significant vascular findings are present. No enlarged abdominal or pelvic lymph nodes. Reproductive: Uterus and  bilateral adnexa are unremarkable. Other: No abdominal wall hernia or abnormality. No abdominopelvic ascites. Musculoskeletal: No acute or significant osseous findings. IMPRESSION: Multiple thick-walled loops of distal s

## 2021-12-12 NOTE — ED Provider Notes (Signed)
? ?[facility] ?Provider Note ? ? ? Event Date/Time  ? First MD Initiated Contact with Patient 12/12/21 1227   ?  (approximate) ? ? ?History  ? ?Abdominal Pain ? ? ?HPI ? ?Audrey Beard is a 26 y.o. female with a history of anemia presents with complaints of abdominal pain, decreased appetite, nausea.  Patient reports 3 days ago she had suprapubic periumbilical abdominal discomfort, it is eased however still there.  She has not had an appetite she has felt mildly nauseated but has not vomited.  Normal stools.  No fevers.  She reports the last time she felt that she was pregnant.  She is not certain when her last normal menstrual cycle was ?  ? ? ?Physical Exam  ? ?Triage Vital Signs: ?ED Triage Vitals  ?Enc Vitals Group  ?   BP 12/12/21 1218 (!) 116/103  ?   Pulse Rate 12/12/21 1218 (!) 141  ?   Resp 12/12/21 1218 16  ?   Temp 12/12/21 1218 98.3 ?F (36.8 ?C)  ?   Temp Source 12/12/21 1218 Oral  ?   SpO2 12/12/21 1218 97 %  ?   Weight 12/12/21 1216 83.5 kg (184 lb)  ?   Height 12/12/21 1216 1.6 m (5\' 3" )  ?   Head Circumference --   ?   Peak Flow --   ?   Pain Score 12/12/21 1222 0  ?   Pain Loc --   ?   Pain Edu? --   ?   Excl. in [place]? --   ? ? ?Most recent vital signs: ?Vitals:  ? 12/12/21 1218 12/12/21 1300  ?BP: (!) 116/103 111/81  ?Pulse: (!) 141 (!) 107  ?Resp: 16 11  ?Temp: 98.3 ?F (36.8 ?C)   ?SpO2: 97% 99%  ? ? ? ?General: Awake, no distress.  ?CV:  Good peripheral perfusion.  ?Resp:  Normal effort.  ?Abd:  No distention.  Mild tenderness to palpation right lower quadrant, left lower quadrant, periumbilical ?Other:   ? ? ?ED Results / Procedures / Treatments  ? ?Labs ?(all labs ordered are listed, but only abnormal results are displayed) ?Labs Reviewed  ?COMPREHENSIVE METABOLIC PANEL - Abnormal; Notable for the following components:  ?    Result Value  ? Potassium 3.0 (*)   ? CO2 18 (*)   ? Glucose, Bld 119 (*)   ? BUN 38 (*)   ? Creatinine, Ser 2.99 (*)   ? Total Protein 9.6 (*)   ?  Total Bilirubin 1.5 (*)   ? GFR, Estimated 22 (*)   ? Anion gap 17 (*)   ? All other components within normal limits  ?CBC - Abnormal; Notable for the following components:  ? WBC 30.2 (*)   ? Hemoglobin 10.7 (*)   ? HCT 35.7 (*)   ? MCV 72.0 (*)   ? MCH 21.6 (*)   ? RDW 19.9 (*)   ? Platelets 468 (*)   ? All other components within normal limits  ?URINALYSIS, ROUTINE W REFLEX MICROSCOPIC - Abnormal; Notable for the following components:  ? Color, Urine AMBER (*)   ? APPearance CLOUDY (*)   ? Hgb urine dipstick LARGE (*)   ? Protein, ur 30 (*)   ? Leukocytes,Ua SMALL (*)   ? Bacteria, UA FEW (*)   ? All other components within normal limits  ?CULTURE, BLOOD (ROUTINE X 2)  ?CULTURE, BLOOD (ROUTINE X 2)  ?LIPASE, BLOOD  ?HCG, QUANTITATIVE, PREGNANCY  ?  LACTIC ACID, PLASMA  ?LACTIC ACID, PLASMA  ?POC URINE PREG, ED  ? ? ? ?EKG ? ?ED ECG REPORT ?I, Lavonia Drafts, the attending physician, personally viewed and interpreted this ECG. ? ?Date: 12/12/2021 ? ?Rhythm: Sinus tachycardia ?QRS Axis: normal ?Intervals: normal ?ST/T Wave abnormalities: normal ?Narrative Interpretation: no evidence of acute ischemia ? ? ? ?RADIOLOGY ?CT abdomen pelvis pending ? ? ? ?PROCEDURES: ? ?Critical Care performed: yes ? ?CRITICAL CARE ?Performed by: Lavonia Drafts ? ? ?Total critical care time: 30 minutes ? ?Critical care time was exclusive of separately billable procedures and treating other patients. ? ?Critical care was necessary to treat or prevent imminent or life-threatening deterioration. ? ?Critical care was time spent personally by me on the following activities: development of treatment plan with patient and/or surrogate as well as nursing, discussions with consultants, evaluation of patient's response to treatment, examination of patient, obtaining history from patient or surrogate, ordering and performing treatments and interventions, ordering and review of laboratory studies, ordering and review of radiographic studies, pulse  oximetry and re-evaluation of patient's condition. ? ? ?Procedures ? ? ?MEDICATIONS ORDERED IN ED: ?Medications  ?cefTRIAXone (ROCEPHIN) 1 g in sodium chloride 0.9 % 100 mL IVPB (has no administration in time range)  ?ondansetron (ZOFRAN) injection 4 mg (4 mg Intravenous Given 12/12/21 1254)  ?0.9 %  sodium chloride infusion (1,000 mLs Intravenous New Bag/Given 12/12/21 1255)  ?morphine (PF) 2 MG/ML injection 2 mg (2 mg Intravenous Given 12/12/21 1254)  ? ? ? ?IMPRESSION / MDM / ASSESSMENT AND PLAN / ED COURSE  ?I reviewed the triage vital signs and the nursing notes. ? ?Patient presents with abdominal pain and significant tachycardia as detailed above consistent with potential acute that poses a threat to life or bodily function ? ?Differential is extensive, includes appendicitis, ectopic pregnancy, pregnancy, UTI, ureterolithiasis ? ?Her white blood cell count is significantly elevated at 30.2, however she denies fevers.  Pending hCG ? ?We will give IV fluid, IV morphine, IV Zofran ? ?Creatinine is significantly elevated at 2.99 consistent with acute kidney injury ? ?I have asked my colleague to follow-up on CT results, patient will require admission ? ? ? ? ? ?  ? ? ?FINAL CLINICAL IMPRESSION(S) / ED DIAGNOSES  ? ?Final diagnoses:  ?Acute kidney injury ([place])  ?Periumbilical abdominal pain  ? ? ? ?Rx / DC Orders  ? ?ED Discharge Orders   ? ? None  ? ?  ? ? ? ?Note:  This document was prepared using Dragon voice recognition software and may include unintentional dictation errors. ?  ?Lavonia Drafts, MD ?12/12/21 1507 ? ?

## 2021-12-12 NOTE — Consult Note (Signed)
CODE SEPSIS - PHARMACY COMMUNICATION ? ?**Broad Spectrum Antibiotics should be administered within 1 hour of Sepsis diagnosis** ? ?Time Code Sepsis Called/Page Received: 1558 ? ?Antibiotics Ordered: Ceftriaxone ? ?Time of 1st antibiotic administration: 1531 ? ?Additional action taken by pharmacy: none ? ?If necessary, Name of Provider/Nurse Contacted: n/a ? ?Thais Silberstein Rodriguez-Guzman PharmD, BCPS ?12/12/2021 3:55 PM ? ?

## 2021-12-12 NOTE — ED Provider Notes (Addendum)
3:49 PM Assumed care for off going team.  ? ?Blood pressure 111/81, pulse (!) 107, temperature 98.3 ?F (36.8 ?C), temperature source Oral, resp. rate 11, height '5\' 3"'  (1.6 m), weight 83.4 kg, SpO2 99 %, unknown if currently breastfeeding. ? ?See their HPI for full report but in brief  ? ?I personally reviewed and interpreted the CT scan thickening of the small bowel is pending the final read ? ?IMPRESSION: ?Multiple thick-walled loops of distal small bowel with perienteric ?fat stranding and upstream dilation, findings are likely due to ?infectious or inflammatory enteritis with secondary small bowel ?obstruction. ? ?3:50 PM given the concern for secondary small bowel obstruction I discussed the case with Dr. Peyton Najjar from surgery who recommended IV antibiotics and medical management but no surgery ? ?Given patient now has met sepsis criteria with a known source of the colitis sepsis alert was called ? ?.Critical Care ?Performed by: Vanessa [place], MD ?Authorized by: Vanessa [place], MD  ? ?Critical care provider statement:  ?  Critical care time (minutes):  30 ?  Critical care was necessary to treat or prevent imminent or life-threatening deterioration of the following conditions:  Sepsis ?  Critical care was time spent personally by me on the following activities:  Development of treatment plan with patient or surrogate, discussions with consultants, evaluation of patient's response to treatment, examination of patient, ordering and review of laboratory studies, ordering and review of radiographic studies, ordering and performing treatments and interventions, pulse oximetry, re-evaluation of patient's condition and review of old charts ? ? ?Patient already been given ceftriaxone so I added on Flagyl to cover anaerobic's ? ?Updated patient on the results.  She denies any current diarrhea to require stool studies. ? ?We will discuss the hospital team for admission ? ?  ?Vanessa [place], MD ?12/12/21 1555 ? ?  ?Vanessa [place], MD ?12/12/21 1557 ? ?

## 2021-12-12 NOTE — Consult Note (Addendum)
SURGICAL CONSULTATION NOTE  ? ?HISTORY OF PRESENT ILLNESS (HPI):  ? ?26 y.o. female presented to [facility] ED for evaluation of abdominal pain, nausea, vomiting, diarrhea. Patient reports she has been having abdominal pain, vomiting and diarrhea since 2 days ago. ? ?Patient endorses that all started after she drank alcohol 2 days ago.  She has been having nausea, vomiting, diarrhea.  She started feeling better after multiple episode of vomiting and diarrhea but today she feels that she was in a pass out.  Abdominal pain was generalized.  No pain radiation.  Cannot identify any alleviating or aggravating factors.  Since she got here and good hydrated with IV fluids she has been getting better. ? ?At the ED she was found leukocytosis, elevated creatinine and BUN, no lactic acidosis.  CT scan of the abdomen and pelvis shows enteritis.  Radiologist report bowel obstruction.  I personally evaluated the images.  There is generalized small bowel wall thickening consistent with enteritis.  There is no small bowel obstruction. ? ?Surgery is consulted by Dr. Maisie Fus in this context for evaluation and management of enteritis with report of small bowel obstruction. ? ?PAST MEDICAL HISTORY (PMH):  ?Past Medical History:  ?Diagnosis Date  ? Anemia   ? Depression   ? Pregnancy induced hypertension   ?  ? ?PAST SURGICAL HISTORY (PSH):  ?Past Surgical History:  ?Procedure Laterality Date  ? NO PAST SURGERIES    ?  ? ?MEDICATIONS:  ?Prior to Admission medications   ?Medication Sig Start Date End Date Taking? Authorizing Provider  ?acetaminophen (TYLENOL) 325 MG tablet Take 2 tablets (650 mg total) by mouth every 4 (four) hours as needed (for pain scale < 4). 05/28/20   McVey, Prudencio Pair, CNM  ?docusate sodium (COLACE) 100 MG capsule Take 1 capsule (100 mg total) by mouth 2 (two) times daily. 05/28/20   McVey, Prudencio Pair, CNM  ?NIFEdipine (ADALAT CC) 60 MG 24 hr tablet Take 1 tablet (60 mg total) by mouth daily. 05/29/20   McVey, Prudencio Pair,  CNM  ?NIFEdipine (PROCARDIA XL/NIFEDICAL XL) 60 MG 24 hr tablet TAKE 1 TABLET BY MOUTH DAILY 05/28/20 05/28/21  McVey, Prudencio Pair, CNM  ?Prenatal Vit-Fe Fumarate-FA (MULTIVITAMIN-PRENATAL) 27-0.8 MG TABS tablet Take 1 tablet by mouth daily at 12 noon.    [provider]  ?  ? ?ALLERGIES:  ?No Known Allergies  ? ?SOCIAL HISTORY:  ?Social History  ? ?Socioeconomic History  ? Marital status: Single  ?  Spouse name: Not on file  ? Number of children: Not on file  ? Years of education: Not on file  ? Highest education level: Not on file  ?Occupational History  ? Not on file  ?Tobacco Use  ? Smoking status: Every Day  ?  Packs/day: 0.25  ?  Years: 4.00  ?  Pack years: 1.00  ?  Types: Cigarettes  ? Smokeless tobacco: Never  ?Vaping Use  ? Vaping Use: Never used  ?Substance and Sexual Activity  ? Alcohol use: No  ? Drug use: Never  ? Sexual activity: Yes  ?  Birth control/protection: Injection  ?Other Topics Concern  ? Not on file  ?Social History Narrative  ? Not on file  ? ?Social Determinants of Health  ? ?Financial Resource Strain: Not on file  ?Food Insecurity: Not on file  ?Transportation Needs: Not on file  ?Physical Activity: Not on file  ?Stress: Not on file  ?Social Connections: Not on file  ?Intimate Partner Violence: Not on file  ?  ? ? ?  FAMILY HISTORY:  ?No family history on file.  ? ?REVIEW OF SYSTEMS:  ?Constitutional: denies weight loss, fever, chills, or sweats  ?Eyes: denies any other vision changes, history of eye injury  ?ENT: denies sore throat, hearing problems  ?Respiratory: denies shortness of breath, wheezing  ?Cardiovascular: denies chest pain, palpitations  ?Gastrointestinal: Positive abdominal pain, nausea and vomiting and diarrhea ?Genitourinary: denies burning with urination or urinary frequency ?Musculoskeletal: denies any other joint pains or cramps  ?Skin: denies any other rashes or skin discolorations  ?Neurological: denies any other headache, dizziness, weakness  ?Psychiatric:  denies any other depression, anxiety  ? ?All other review of systems were negative  ? ?VITAL SIGNS:  ?Temp:  [98.3 ?F (36.8 ?C)] 98.3 ?F (36.8 ?C) (05/12 1218) ?Pulse Rate:  [96-141] 96 (05/12 1557) ?Resp:  [11-16] 15 (05/12 1557) ?BP: (111-119)/(64-103) 119/64 (05/12 1557) ?SpO2:  [97 %-100 %] 100 % (05/12 1557) ?Weight:  [83.4 kg-83.5 kg] 83.4 kg (05/12 1222)     Height: 5\' 3"  (160 cm) Weight: 83.4 kg BMI (Calculated): 32.58  ? ?INTAKE/OUTPUT:  ?This shift: Total I/O ?In: 1100 [I.V.:1000; IV Piggyback:100] ?Out: -   ?Last 2 shifts: @IOLAST2SHIFTS @  ? ?PHYSICAL EXAM:  ?Constitutional:  ?-- Normal body habitus  ?-- Awake, alert, and oriented x3  ?Eyes:  ?-- Pupils equally round and reactive to light  ?-- No scleral icterus  ?Ear, nose, and throat:  ?-- No jugular venous distension  ?Pulmonary:  ?-- No crackles  ?-- Equal breath sounds bilaterally ?-- Breathing non-labored at rest ?Cardiovascular:  ?-- S1, S2 present  ?-- No pericardial rubs ?Gastrointestinal:  ?-- Abdomen soft, nontender, non-distended, no guarding or rebound tenderness ?-- No abdominal masses appreciated, pulsatile or otherwise  ?Musculoskeletal and Integumentary:  ?-- Wounds: None appreciated ?-- Extremities: B/L UE and LE FROM, hands and feet warm, no edema  ?Neurologic:  ?-- Motor function: intact and symmetric ?-- Sensation: intact and symmetric ? ? ?Labs:  ? ?  Latest Ref Rng & Units 12/12/2021  ? 12:32 PM 05/28/2020  ?  9:21 AM 05/27/2020  ?  6:33 AM  ?CBC  ?WBC 4.0 - 10.5 K/uL 30.2   11.6   15.9    ?Hemoglobin 12.0 - 15.0 g/dL 16.110.7   09.610.8   9.4    ?Hematocrit 36.0 - 46.0 % 35.7   34.9   30.0    ?Platelets 150 - 400 K/uL 468   364   273    ? ? ?  Latest Ref Rng & Units 12/12/2021  ? 12:32 PM 05/28/2020  ?  9:25 AM 05/27/2020  ?  6:33 AM  ?CMP  ?Glucose 70 - 99 mg/dL 045119   91   86    ?BUN 6 - 20 mg/dL 38   6   <5    ?Creatinine 0.44 - 1.00 mg/dL 4.092.99   8.110.55   9.140.58    ?Sodium 135 - 145 mmol/L 137   138   137    ?Potassium 3.5 - 5.1 mmol/L 3.0    3.2   3.0    ?Chloride 98 - 111 mmol/L 102   107   110    ?CO2 22 - 32 mmol/L 18   21   20     ?Calcium 8.9 - 10.3 mg/dL 9.3   9.0   8.6    ?Total Protein 6.5 - 8.1 g/dL 9.6   6.6   6.0    ?Total Bilirubin 0.3 - 1.2 mg/dL 1.5   0.9  0.8    ?Alkaline Phos 38 - 126 U/L 104   155   156    ?AST 15 - 41 U/L 21   22   19     ?ALT 0 - 44 U/L 16   10   10     ? ? ?Imaging studies:  ?EXAM: ?CT ABDOMEN AND PELVIS WITHOUT CONTRAST ?  ?TECHNIQUE: ?Multidetector CT imaging of the abdomen and pelvis was performed ?following the standard protocol without IV contrast. ?  ?RADIATION DOSE REDUCTION: This exam was performed according to the ?departmental dose-optimization program which includes automated ?exposure control, adjustment of the mA and/or kV according to ?patient size and/or use of iterative reconstruction technique. ?  ?COMPARISON:  CT abdomen pelvis dated September 01, 2010 ?  ?FINDINGS: ?Lower chest: No acute abnormality. ?  ?Hepatobiliary: No focal liver lesions. Cholelithiasis with no ?gallbladder wall thickening. No biliary ductal dilation. ?  ?Pancreas: Unremarkable. No pancreatic ductal dilatation or ?surrounding inflammatory changes. ?  ?Spleen: Normal in size without focal abnormality. ?  ?Adrenals/Urinary Tract: Adrenal glands are unremarkable. Kidneys are ?normal, without renal calculi, focal lesion, or hydronephrosis. ?Bladder is unremarkable. ?  ?Stomach/Bowel: Wall thickening of multiple distal small bowel loops ?with perienteric fat stranding, and upstream dilation. A transition ?point is seen in the right lower quadrant on series 2, image 45. ?Stomach is unremarkable. Normal appendix. Colon is decompressed. ?  ?Vascular/Lymphatic: No significant vascular findings are present. No ?enlarged abdominal or pelvic lymph nodes. ?  ?Reproductive: Uterus and bilateral adnexa are unremarkable. ?  ?Other: No abdominal wall hernia or abnormality. No abdominopelvic ?ascites. ?  ?Musculoskeletal: No acute or significant  osseous findings. ?  ?IMPRESSION: ?Multiple thick-walled loops of distal small bowel with perienteric ?fat stranding and upstream dilation, findings are likely due to ?infectious or inflammatory enteritis wi

## 2021-12-12 NOTE — ED Notes (Signed)
Pt c/o burning at the site due to IV potassium infusion. Rate changed due to patient c/o burning. Primary RN Loree Fee made aware. Blood work collected at time of 2nd IV initiation by this Therapist, sports and sent to lab.  ?

## 2021-12-13 ENCOUNTER — Encounter: Payer: Self-pay | Admitting: Internal Medicine

## 2021-12-13 DIAGNOSIS — A419 Sepsis, unspecified organism: Secondary | ICD-10-CM | POA: Diagnosis not present

## 2021-12-13 DIAGNOSIS — N179 Acute kidney failure, unspecified: Secondary | ICD-10-CM | POA: Diagnosis not present

## 2021-12-13 DIAGNOSIS — R8290 Unspecified abnormal findings in urine: Secondary | ICD-10-CM

## 2021-12-13 DIAGNOSIS — K529 Noninfective gastroenteritis and colitis, unspecified: Secondary | ICD-10-CM | POA: Diagnosis not present

## 2021-12-13 HISTORY — DX: Acute kidney failure, unspecified: N17.9

## 2021-12-13 LAB — COMPREHENSIVE METABOLIC PANEL
ALT: 11 U/L (ref 0–44)
AST: 16 U/L (ref 15–41)
Albumin: 3 g/dL — ABNORMAL LOW (ref 3.5–5.0)
Alkaline Phosphatase: 72 U/L (ref 38–126)
Anion gap: 8 (ref 5–15)
BUN: 24 mg/dL — ABNORMAL HIGH (ref 6–20)
CO2: 18 mmol/L — ABNORMAL LOW (ref 22–32)
Calcium: 8.2 mg/dL — ABNORMAL LOW (ref 8.9–10.3)
Chloride: 109 mmol/L (ref 98–111)
Creatinine, Ser: 0.96 mg/dL (ref 0.44–1.00)
GFR, Estimated: >60 mL/min (ref >60)
Glucose, Bld: 92 mg/dL (ref 70–99)
Potassium: 3.4 mmol/L — ABNORMAL LOW (ref 3.5–5.1)
Sodium: 135 mmol/L (ref 135–145)
Total Bilirubin: 1.1 mg/dL (ref 0.3–1.2)
Total Protein: 6.9 g/dL (ref 6.5–8.1)

## 2021-12-13 LAB — CBC
HCT: 26.6 % — ABNORMAL LOW (ref 36.0–46.0)
Hemoglobin: 7.8 g/dL — ABNORMAL LOW (ref 12.0–15.0)
MCH: 21.7 pg — ABNORMAL LOW (ref 26.0–34.0)
MCHC: 29.3 g/dL — ABNORMAL LOW (ref 30.0–36.0)
MCV: 74.1 fL — ABNORMAL LOW (ref 80.0–100.0)
Platelets: 326 10*3/uL (ref 150–400)
RBC: 3.59 MIL/uL — ABNORMAL LOW (ref 3.87–5.11)
RDW: 19.9 % — ABNORMAL HIGH (ref 11.5–15.5)
WBC: 19.4 10*3/uL — ABNORMAL HIGH (ref 4.0–10.5)
nRBC: 0 % (ref 0.0–0.2)

## 2021-12-13 LAB — C DIFFICILE QUICK SCREEN W PCR REFLEX
C Diff antigen: NEGATIVE
C Diff interpretation: NOT DETECTED
C Diff toxin: NEGATIVE

## 2021-12-13 MED ORDER — ENOXAPARIN SODIUM 40 MG/0.4ML IJ SOSY: 40.0000 mg | PREFILLED_SYRINGE | INTRAMUSCULAR | Status: DC

## 2021-12-13 MED ORDER — AZITHROMYCIN 250 MG PO TABS
500.0000 mg | ORAL_TABLET | Freq: Every day | ORAL | Status: DC
  Administered 2021-12-13 – 2021-12-14 (×2): 500 mg via ORAL
  Filled 2021-12-13 (×2): qty 2

## 2021-12-13 NOTE — Progress Notes (Signed)
Patient ID: Audrey Beard, female   DOB: 1995/12/17, 26 y.o.   MRN: [id number] ?    SURGICAL PROGRESS NOTE  ? ?Hospital Day(s): 1.  ? ?Interval History: Patient seen and examined, no acute events or new complaints overnight. Patient reports feeling better today.  She denies nausea or vomiting.  She endorses that she tolerated diet.  She has eaten solid food.  Continue having mild diarrhea.  Denies abdominal pain.  No pain radiation.  Alleviating or aggravating factors. ? ?Vital signs in last 24 hours: [min-max] current  ?Temp:  [98 ?F (36.7 ?C)-98.6 ?F (37 ?C)] 98.6 ?F (37 ?C) (05/13 1110) ?Pulse Rate:  [86-107] 86 (05/13 1110) ?Resp:  [11-24] 14 (05/13 1110) ?BP: (111-130)/(61-81) 130/63 (05/13 1110) ?SpO2:  [97 %-100 %] 100 % (05/13 1110)     Height: 5\' 3"  (160 cm) Weight: 83.4 kg BMI (Calculated): 32.58  ? ?Physical Exam:  ?Constitutional: alert, cooperative and no distress  ?Respiratory: breathing non-labored at rest  ?Cardiovascular: regular rate and sinus rhythm  ?Gastrointestinal: soft, non-tender, and non-distended ? ?Labs:  ? ?  Latest Ref Rng & Units 12/13/2021  ?  5:00 AM 12/12/2021  ?  5:58 PM 12/12/2021  ? 12:32 PM  ?CBC  ?WBC 4.0 - 10.5 K/uL 19.4   28.5   30.2    ?Hemoglobin 12.0 - 15.0 g/dL 7.8   9.2   02/11/2022    ?Hematocrit 36.0 - 46.0 % 26.6   31.5   35.7    ?Platelets 150 - 400 K/uL 326   382   468    ? ? ?  Latest Ref Rng & Units 12/13/2021  ?  5:00 AM 12/12/2021  ?  8:49 PM 12/12/2021  ?  5:58 PM  ?CMP  ?Glucose 70 - 99 mg/dL 92   84   02/11/2022    ?BUN 6 - 20 mg/dL 24   32   37    ?Creatinine 0.44 - 1.00 mg/dL 637   8.58   8.50    ?Sodium 135 - 145 mmol/L 135   135   138    ?Potassium 3.5 - 5.1 mmol/L 3.4   3.5   2.8    ?Chloride 98 - 111 mmol/L 109   109   108    ?CO2 22 - 32 mmol/L 18   16   21     ?Calcium 8.9 - 10.3 mg/dL 8.2   8.0   8.3    ?Total Protein 6.5 - 8.1 g/dL 6.9    8.2    ?Total Bilirubin 0.3 - 1.2 mg/dL 1.1    1.2    ?Alkaline Phos 38 - 126 U/L 72    85    ?AST 15 - 41 U/L 16    17    ?ALT 0 -  44 U/L 11    14    ? ? ?Imaging studies: No new pertinent imaging studies ? ? ?Assessment/Plan:  ?26 y.o. female with enteritis. ? ?-Patient responding adequately to aggressive IV hydration.  Today white blood cell count significantly decreased.  Renal function within normal limits today. ?-Patient tolerating solid diet. ?-Again, clinically the patient does not have any small bowel obstruction.  She is tolerating solid diet.  No surgical intervention for enteritis recommended.  We will sign off.  Contact if there is any change in her clinical status. ? ?22, MD ? ? ? ?

## 2021-12-13 NOTE — Assessment & Plan Note (Addendum)
Mult specias on culture, no significant WBC on micro  ?

## 2021-12-13 NOTE — Progress Notes (Signed)
Report given to floor nurse

## 2021-12-13 NOTE — Assessment & Plan Note (Signed)
Present on admission, improved/resolved with appropriate IV fluid treatment ?Nephrology consult canceled ?

## 2021-12-13 NOTE — Assessment & Plan Note (Signed)
See above

## 2021-12-13 NOTE — Assessment & Plan Note (Addendum)
Present on admission, criteria met given tachycardia, elevated WBC, source of infection enteritis ?Resolved as of 12/14/21 WBC trending down and VS stable  ?NO growth x2 days on blood cultures, GI PCR panel not collected d/t no BM. CDiff negative ?broad spectrum abx CTX/flaygl x1 dose in ED, significant improvement w/ this and IV lfuids ?trnasition to azithromycin for colitis, avoid FQ d/t cdiff risk  ?Continue abx w/ azithromycin 500 mg x3 days total  ? ?

## 2021-12-13 NOTE — Progress Notes (Signed)
?PROGRESS NOTE ? ? ? Audrey Beard  UOO:[id number] DOB: 01/29/1996  ?DOA: 12/12/2021 ?Date of Service: 12/13/21 ?PCP: [facility] [place] ? ? ? ? ?Brief Narrative / Hospital Course:  ?Patient presented to emergency department 12/12/2021, chief complaint abdominal pain, N/V/D x3 days, progressively worsening.  Associated with chills, no fever.  Last episode of loose stools morning prior to presentation in the ED, no black/bloody stool.  Prior to onset of symptoms, notes moderate alcohol consumption.   ?In ED, afebrile, tachycardic, WBC 30, CT abdomen pelvis showed multiple thick-walled loops distal small bowel, perienteric fat stranding, upstream dilatation, concern for infectious/inflammatory enteritis, small bowel obstruction.  AKI with associated metabolic acidosis.  Patient was treated with morphine, Zofran, IV fluids with normal saline, ceftriaxone, metronidazole.  Patient was admitted to hospitalist service.  Continued on ceftriaxone/metronidazole.  General surgery was consulted, patient does not have SBO clinically given that she is passing flatus and stool.   ?12/13/2021, patient reports continued improvement however still having significant abdominal discomfort and nausea, she is tolerating diet, continuing to have mild diarrhea.  Let cell count significantly decreased and renal function back within normal limits.  General surgery has signed off, will reconsult if any change.  Stool studies have not been collected but diarrhea has improved, WBC improved.  ? ?Consultants:  ?General surgery  ? ?Procedures: ?none ? ? ? ?Subjective: ?Patient reports feeling pain is improved w/ IV fluids but still significantly weak and nauseous. Some diarrhea but better than yesterday  ? ? ? ? ?ASSESSMENT & PLAN: ?  ?Principal Problem: ?  Sepsis ([place]) ?Active Problems: ?  AKI (acute kidney injury) ([place]) ?  Abnormal urinalysis ?  Enteritis ? ? ?Sepsis ([place]) ?Present on admission, criteria met given tachycardia,  elevated WBC, source of infection enteritis ?Pending blood cultures, GI PCR panel ?broad spectrum abx CTX/flaygl x1 dose in ED, significant improvement w/ this, will trnasition to azithromycin for colitis, avoid FQ d/t cdiff risk  ?Continue abx w/ azithromycin 500 mg x3 days total  ? ? ?AKI (acute kidney injury) ([place]) ?Present on admission, improved/resolved with appropriate IV fluid treatment ?Nephrology consult canceled ? ?Abnormal urinalysis ?F/u culture, no significant WBC on micro  ? ?Enteritis ?See above ? ? ? ? ? ? ?DVT prophylaxis: lovenox ?Code Status: FULL ?Family Communication: pt decleins call to support person(s)  ?Disposition Plan / TOC needs: none ?Barriers to discharge / significant pending items: clinical improvement slow advancement of diet, if tolerating diet and WBC still trending down may be able to d/c home tomorrow 05/14 ? ? ? ? ? ? ? ? ? ? ? ? ?Objective: ?Vitals:  ? 12/12/21 2200 12/13/21 0029 12/13/21 0430 12/13/21 1110  ?BP: 113/73 121/67 112/74 130/63  ?Pulse: 94 89 87 86  ?Resp: 17 (!) _0 ?Temp:  98 ?F (36.7 ?C) 98.3 ?F (36.8 ?C) 98.6 ?F (37 ?C)  ?TempSrc:  Oral Oral Oral  ?SpO2: 97% 100% 99% 100%  ?Weight:      ?Height:      ? ? ?Intake/Output Summary (Last 24 hours) at 12/13/2021 1417 ?Last data filed at 12/12/2021 2247 ?Gross per 24 hour  ?Intake 1500 ml  ?Output --  ?Net 1500 ml  ? ?Filed Weights  ? 12/12/21 1216 12/12/21 1222  ?Weight: 83.5 kg 83.4 kg  ? ? ?Examination:  ?Constitutional:  ?VS as above ?General Appearance: alert, well-developed, well-nourished, NAD ?Neck: ?No masses, trachea midline ?No thyroid enlargement/tenderness/mass appreciated ?Respiratory: ?Normal respiratory effort ?Breath sounds normal,  no wheeze/rhonchi/rales ?Cardiovascular: ?S1/S2 normal, no murmur/rub/gallop auscultated ?No lower extremity edema ?Gastrointestinal: ?Nontender, no masses ?No hepatomegaly, no splenomegaly ?No hernia appreciated ?Musculoskeletal:  ?No clubbing/cyanosis of  digits ?Neurological: ?No cranial nerve deficit on limited exam ?Motor and sensation intact and symmetric ?Psychiatric: ?Normal judgment/insight ?Normal mood and affect ? ? ? ? ? ? ?Scheduled Medications:  ? azithromycin  500 mg Oral Daily  ? sodium chloride flush  3 mL Intravenous Q12H  ? ? ?Continuous Infusions: ? sodium chloride 150 mL/hr at 12/13/21 0818  ? acetaminophen    ? ? ?PRN Medications:  ?acetaminophen, ondansetron **OR** ondansetron (ZOFRAN) IV ? ?Antimicrobials:  ?Anti-infectives (From admission, onward)  ? ? Start     Dose/Rate Route Frequency Ordered Stop  ? 12/13/21 1430  azithromycin (ZITHROMAX) tablet 500 mg       ? 500 mg Oral Daily 12/13/21 1416 12/16/21 0959  ? 12/12/21 1600  piperacillin-tazobactam (ZOSYN) IVPB 3.375 g  Status:  Discontinued       ? 3.375 g ?100 mL/hr over 30 Minutes Intravenous  Once 12/12/21 1551 12/12/21 1555  ? 12/12/21 1600  metroNIDAZOLE (FLAGYL) IVPB 500 mg       ? 500 mg ?100 mL/hr over 60 Minutes Intravenous  Once 12/12/21 1555 12/12/21 1726  ? 12/12/21 1400  cefTRIAXone (ROCEPHIN) 1 g in sodium chloride 0.9 % 100 mL IVPB       ? 1 g ?200 mL/hr over 30 Minutes Intravenous  Once 12/12/21 1359 12/12/21 1601  ? ?  ? ? ?Data Reviewed: I have personally reviewed following labs and imaging studies ? ?CBC: ?Recent Labs  ?Lab 12/12/21 ?1232 12/12/21 ?1758 12/13/21 ?0500  ?WBC 30.2* 28.5* 19.4*  ?HGB 10.7* 9.2* 7.8*  ?HCT 35.7* 31.5* 26.6*  ?MCV 72.0* 74.5* 74.1*  ?PLT 468* 382 326  ? ?Basic Metabolic Panel: ?Recent Labs  ?Lab 12/12/21 ?1232 12/12/21 ?1758 12/12/21 ?2049 12/13/21 ?0500  ?NA 137 138 135 135  ?K 3.0* 2.8* 3.5 3.4*  ?CL 102 108 109 109  ?CO2 18* 21* 16* 18*  ?GLUCOSE 119* 104* 84 92  ?BUN 38* 37* 32* 24*  ?CREATININE 2.99* 1.84* 1.40* 0.96  ?CALCIUM 9.3 8.3* 8.0* 8.2*  ?MG  --  2.1  --   --   ?PHOS  --  3.9  --   --   ? ?GFR: ?Estimated Creatinine Clearance: 91.6 mL/min (by C-G formula based on SCr of 0.96 mg/dL). ?Liver Function Tests: ?Recent Labs  ?Lab  12/12/21 ?1232 12/12/21 ?1758 12/13/21 ?0500  ?AST _0 ?ALT _1 ?ALKPHOS 104 85 72  ?BILITOT 1.5* 1.2 1.1  ?PROT 9.6* 8.2* 6.9  ?ALBUMIN 4.3 3.7 3.0*  ? ?Recent Labs  ?Lab 12/12/21 ?1232  ?LIPASE 34  ? ?No results for input(s): AMMONIA in the last 168 hours. ?Coagulation Profile: ?No results for input(s): INR, PROTIME in the last 168 hours. ?Cardiac Enzymes: ?No results for input(s): CKTOTAL, CKMB, CKMBINDEX, TROPONINI in the last 168 hours. ?BNP (last 3 results) ?No results for input(s): PROBNP in the last 8760 hours. ?HbA1C: ?No results for input(s): HGBA1C in the last 72 hours. ?CBG: ?No results for input(s): GLUCAP in the last 168 hours. ?Lipid Profile: ?No results for input(s): CHOL, HDL, LDLCALC, TRIG, CHOLHDL, LDLDIRECT in the last 72 hours. ?Thyroid Function Tests: ?No results for input(s): TSH, T4TOTAL, FREET4, T3FREE, THYROIDAB in the last 72 hours. ?Anemia Panel: ?No results for input(s): VITAMINB12, FOLATE, FERRITIN, TIBC, IRON, RETICCTPCT in the last 72 hours. ?Urine analysis: ?   ?  Component Value Date/Time  ? [place] (A) 12/12/2021 1224  ? APPEARANCEUR CLOUDY (A) 12/12/2021 1224  ? APPEARANCEUR Hazy 09/04/2014 2030  ? LABSPEC 1.018 12/12/2021 1224  ? LABSPEC 1.018 09/04/2014 2030  ? PHURINE 5.0 12/12/2021 1224  ? GLUCOSEU NEGATIVE 12/12/2021 1224  ? GLUCOSEU Negative 09/04/2014 2030  ? HGBUR LARGE (A) 12/12/2021 1224  ? [place] NEGATIVE 12/12/2021 1224  ? BILIRUBINUR Negative 09/04/2014 2030  ? [place] NEGATIVE 12/12/2021 1224  ? PROTEINUR 30 (A) 12/12/2021 1224  ? NITRITE NEGATIVE 12/12/2021 1224  ? LEUKOCYTESUR SMALL (A) 12/12/2021 1224  ? LEUKOCYTESUR 1+ 09/04/2014 2030  ? ?Sepsis Labs: ?_0 (procalcitonin:4,lacticidven:4) ? ?Recent Results (from the past 240 hour(s))  ?Blood culture (routine x 2)     Status: None (Preliminary result)  ? Collection Time: 12/12/21  2:33 PM  ? Specimen: BLOOD  ?Result Value Ref Range Status  ? Specimen Description BLOOD LEFT  ANTECUBITAL  Final  ? Special Requests   Final  ?  BOTTLES DRAWN AEROBIC AND ANAEROBIC Blood Culture adequate volume  ? Culture   Final  ?  NO GROWTH < 24 HOURS ?Performed at [facility], [address] [place],

## 2021-12-14 DIAGNOSIS — A419 Sepsis, unspecified organism: Secondary | ICD-10-CM | POA: Diagnosis not present

## 2021-12-14 DIAGNOSIS — N179 Acute kidney failure, unspecified: Secondary | ICD-10-CM | POA: Diagnosis not present

## 2021-12-14 DIAGNOSIS — E876 Hypokalemia: Secondary | ICD-10-CM

## 2021-12-14 DIAGNOSIS — K529 Noninfective gastroenteritis and colitis, unspecified: Secondary | ICD-10-CM | POA: Diagnosis not present

## 2021-12-14 DIAGNOSIS — R8290 Unspecified abnormal findings in urine: Secondary | ICD-10-CM | POA: Diagnosis not present

## 2021-12-14 LAB — CBC
HCT: 25.5 % — ABNORMAL LOW (ref 36.0–46.0)
Hemoglobin: 7.5 g/dL — ABNORMAL LOW (ref 12.0–15.0)
MCH: 21.6 pg — ABNORMAL LOW (ref 26.0–34.0)
MCHC: 29.4 g/dL — ABNORMAL LOW (ref 30.0–36.0)
MCV: 73.5 fL — ABNORMAL LOW (ref 80.0–100.0)
Platelets: 320 10*3/uL (ref 150–400)
RBC: 3.47 MIL/uL — ABNORMAL LOW (ref 3.87–5.11)
RDW: 19.6 % — ABNORMAL HIGH (ref 11.5–15.5)
WBC: 10.6 10*3/uL — ABNORMAL HIGH (ref 4.0–10.5)
nRBC: 0 % (ref 0.0–0.2)

## 2021-12-14 LAB — BASIC METABOLIC PANEL
Anion gap: 9 (ref 5–15)
BUN: 8 mg/dL (ref 6–20)
CO2: 21 mmol/L — ABNORMAL LOW (ref 22–32)
Calcium: 8.4 mg/dL — ABNORMAL LOW (ref 8.9–10.3)
Chloride: 107 mmol/L (ref 98–111)
Creatinine, Ser: 0.57 mg/dL (ref 0.44–1.00)
GFR, Estimated: >60 mL/min (ref >60)
Glucose, Bld: 85 mg/dL (ref 70–99)
Potassium: 3.1 mmol/L — ABNORMAL LOW (ref 3.5–5.1)
Sodium: 137 mmol/L (ref 135–145)

## 2021-12-14 LAB — C-REACTIVE PROTEIN: CRP: 9.6 mg/dL — ABNORMAL HIGH (ref <1.0)

## 2021-12-14 LAB — URINE CULTURE: Culture: >=100000 — AB

## 2021-12-14 MED ORDER — POTASSIUM CHLORIDE CRYS ER 20 MEQ PO TBCR: 40.0000 meq | EXTENDED_RELEASE_TABLET | Freq: Every day | ORAL | 0 refills | Status: DC

## 2021-12-14 MED ORDER — AZITHROMYCIN 500 MG PO TABS: 500.0000 mg | ORAL_TABLET | Freq: Every day | ORAL | 0 refills | Status: DC

## 2021-12-14 MED ORDER — ONDANSETRON HCL 4 MG PO TABS: 4.0000 mg | ORAL_TABLET | Freq: Three times a day (TID) | ORAL | 0 refills | Status: DC | PRN

## 2021-12-14 NOTE — Plan of Care (Signed)
?  Problem: Education: ?Goal: Knowledge of General Education information will improve ?Description: Including pain rating scale, medication(s)/side effects and non-pharmacologic comfort measures ?12/14/2021 1120 by Emelda Brothers, RN ?Outcome: Adequate for Discharge ?12/14/2021 1120 by Emelda Brothers, RN ?Outcome: Progressing ?  ?Problem: Health Behavior/Discharge Planning: ?Goal: Ability to manage health-related needs will improve ?12/14/2021 1120 by Emelda Brothers, RN ?Outcome: Adequate for Discharge ?12/14/2021 1120 by Emelda Brothers, RN ?Outcome: Progressing ?  ?Problem: Clinical Measurements: ?Goal: Ability to maintain clinical measurements within normal limits will improve ?12/14/2021 1120 by Emelda Brothers, RN ?Outcome: Adequate for Discharge ?12/14/2021 1120 by Emelda Brothers, RN ?Outcome: Progressing ?Goal: Will remain free from infection ?12/14/2021 1120 by Emelda Brothers, RN ?Outcome: Adequate for Discharge ?12/14/2021 1120 by Emelda Brothers, RN ?Outcome: Progressing ?Goal: Diagnostic test results will improve ?12/14/2021 1120 by Emelda Brothers, RN ?Outcome: Adequate for Discharge ?12/14/2021 1120 by Emelda Brothers, RN ?Outcome: Progressing ?  ?Problem: Nutrition: ?Goal: Adequate nutrition will be maintained ?12/14/2021 1120 by Emelda Brothers, RN ?Outcome: Adequate for Discharge ?12/14/2021 1120 by Emelda Brothers, RN ?Outcome: Progressing ?  ?Problem: Coping: ?Goal: Level of anxiety will decrease ?12/14/2021 1120 by Emelda Brothers, RN ?Outcome: Adequate for Discharge ?12/14/2021 1120 by Emelda Brothers, RN ?Outcome: Progressing ?  ?Problem: Elimination: ?Goal: Will not experience complications related to bowel motility ?12/14/2021 1120 by Emelda Brothers, RN ?Outcome: Adequate for Discharge ?12/14/2021 1120 by Emelda Brothers, RN ?Outcome: Progressing ?  ?Problem: Safety: ?Goal: Ability to remain free from injury will improve ?12/14/2021 1120 by Emelda Brothers, RN ?Outcome: Adequate for Discharge ?12/14/2021 1120 by Emelda Brothers, RN ?Outcome: Progressing ?  ?

## 2021-12-14 NOTE — Progress Notes (Signed)
Patient has flat affect and was very emotional during shift, tearful, and crying at times. Patient wants to go home, is tired of lab sticks, and states she is fearful of all of the medicine. Patient reassured and comforted as needed. Will continue to monitor.  ?

## 2021-12-14 NOTE — Discharge Summary (Signed)
Physician Discharge Summary  ?Audrey Beard LOV:[id number] DOB: 1995/09/13 DOA: 12/12/2021 ? ?PCP: [facility] ? ?Admit date: 12/12/2021 ?Discharge date: 12/14/2021 ? ?Admitted From: home ?Disposition:  home ? ?Recommendations for Outpatient Follow-up:  ?Follow up with PCP in 1-2 weeks - recheck potassium  ? ?Home Health: none  ?Equipment/Devices: none ? ?Discharge Condition: good  ?CODE STATUS: FULL  ?Diet recommendation: advance as tolerated  ?Diet Orders (From admission, onward)  ? ?  Start     Ordered  ? 12/14/21 0000  Diet - low sodium heart healthy       ? 12/14/21 0917  ? 12/12/21 2247  Diet regular Room service appropriate? Yes; Fluid consistency: Thin  Diet effective now       ?Question Answer Comment  ?Room service appropriate? Yes   ?Fluid consistency: Thin   ?  ? 12/12/21 2246  ? ?  ?  ? ?  ? ? ?Brief Narrative / Hospital Course:  ?Patient presented to emergency department 12/12/2021, chief complaint abdominal pain, N/V/D x3 days, progressively worsening.  Associated with chills, no fever.  Last episode of loose stools morning prior to presentation in the ED, no black/bloody stool.  Prior to onset of symptoms, notes moderate alcohol consumption.   ?In ED, afebrile, tachycardic, WBC 30, CT abdomen pelvis showed multiple thick-walled loops distal small bowel, perienteric fat stranding, upstream dilatation, concern for infectious/inflammatory enteritis, small bowel obstruction.  AKI with associated metabolic acidosis.  Patient was treated with morphine, Zofran, IV fluids with normal saline, ceftriaxone, metronidazole.  Patient was admitted to hospitalist service.  Continued on ceftriaxone/metronidazole.  General surgery was consulted, patient does not have SBO clinically given that she is passing flatus and stool.   ?12/13/2021, patient reports continued improvement however still having significant abdominal discomfort and nausea, she is tolerating diet, continuing to have mild diarrhea.   Let cell count significantly decreased and renal function back within normal limits.  General surgery has signed off, will reconsult if any change.  Stool studies have not been collected but diarrhea has improved, WBC improved.  ?12/14/21: pt wants to go home, no BM overnight, passing flatus, no nausea, tolerating diet  ?  ?Consultants:  ?General surgery  ?  ?Procedures: ?none ? ? ? ? ?Discharge Diagnoses: ?Principal Problem: ?  Sepsis ([place]) ?Active Problems: ?  Hypokalemia ?  AKI (acute kidney injury) ([place]) ?  Abnormal urinalysis ?  Enteritis ? ? ? ?Assessment & Plan: ?Sepsis ([place]) ?Present on admission, criteria met given tachycardia, elevated WBC, source of infection enteritis ?Resolved as of 12/14/21 WBC trending down and VS stable  ?NO growth x2 days on blood cultures, GI PCR panel not collected d/t no BM. CDiff negative ?broad spectrum abx CTX/flaygl x1 dose in ED, significant improvement w/ this and IV lfuids ?trnasition to azithromycin for colitis, avoid FQ d/t cdiff risk  ?Continue abx w/ azithromycin 500 mg x3 days total  ? ? ?AKI (acute kidney injury) ([place]) ?Present on admission, improved/resolved with appropriate IV fluid treatment ?Nephrology consult canceled ? ?Abnormal urinalysis ?Mult specias on culture, no significant WBC on micro  ? ?Enteritis ?See above ? ?Hypokalemia ?D/t n/v/d ?Repleted, sent home on po supplementation ?PCP should recheck BMP ? ? ? ? ? ?Discharge Instructions ? ?Discharge Instructions   ? ? Call MD for:   Complete by: As directed ?  ? Persistent nausea, vomiting, diarrhea, fever, weakness  ? Diet - low sodium heart healthy   Complete by: As directed ?  ?  Discharge instructions   Complete by: As directed ?  ? Advance diet as tolerated ?Limit alcohol use ?Complete one more pill of antibiotics tomorrow  ?Nausea medicines also prescribed to take as needed  ? Increase activity slowly   Complete by: As directed ?  ? ?  ? ?Allergies as of 12/14/2021   ?No Known Allergies ?  ? ?   ?Medication List  ?  ? ?STOP taking these medications   ? ?acetaminophen 325 MG tablet ?Commonly known as: Tylenol ?  ?docusate sodium 100 MG capsule ?Commonly known as: COLACE ?  ?multivitamin-prenatal 27-0.8 MG Tabs tablet ?  ?NIFEdipine 60 MG 24 hr tablet ?Commonly known as: PROCARDIA XL/NIFEDICAL XL ?  ? ?  ? ?TAKE these medications   ? ?azithromycin 500 MG tablet ?Commonly known as: ZITHROMAX ?Take 1 tablet (500 mg total) by mouth daily. ?Start taking on: Dec 15, 2021 ?  ?ondansetron 4 MG tablet ?Commonly known as: ZOFRAN ?Take 1 tablet (4 mg total) by mouth every 8 (eight) hours as needed for nausea. ?  ?potassium chloride SA 20 MEQ tablet ?Commonly known as: KLOR-CON M ?Take 2 tablets (40 mEq total) by mouth daily for 3 days. ?  ? ?  ? ?Diet Orders (From admission, onward)  ? ?  Start     Ordered  ? 12/14/21 0000  Diet - low sodium heart healthy       ? 12/14/21 0917  ? 12/12/21 2247  Diet regular Room service appropriate? Yes; Fluid consistency: Thin  Diet effective now       ?Question Answer Comment  ?Room service appropriate? Yes   ?Fluid consistency: Thin   ?  ? 12/12/21 2246  ? ?  ?  ? ?  ? ? ? ? ?No Known Allergies ? ? ?Subjective: pt wants to go home, no BM overnight, passing flatus, no nausea, tolerating diet  ? ? ?Discharge Exam: ?Vitals:  ? 12/14/21 0404 12/14/21 0807  ?BP: (!) 105/51 115/78  ?Pulse: 72 75  ?Resp: 14 16  ?Temp: 98.1 ?F (36.7 ?C) 98.1 ?F (36.7 ?C)  ?SpO2: 100% 100%  ? ?Vitals:  ? 12/13/21 1933 12/14/21 0000 12/14/21 0404 12/14/21 0807  ?BP: (!) 114/53 113/61 (!) 105/51 115/78  ?Pulse: 73 82 72 75  ?Resp: 19 (!) '22 14 16  ' ?Temp: 98.3 ?F (36.8 ?C) 98.5 ?F (36.9 ?C) 98.1 ?F (36.7 ?C) 98.1 ?F (36.7 ?C)  ?TempSrc: Oral Oral Oral   ?SpO2: 100% 99% 100% 100%  ?Weight:   90.3 kg   ?Height:      ? ? ? ?General: Pt is alert, awake, not in acute distress ?Cardiovascular: RRR, S1/S2 +, no rubs, no gallops ?Respiratory: CTA bilaterally, no wheezing, no rhonchi ?Abdominal: Soft, NT, ND, bowel  sounds + ?Extremities: no edema, no cyanosis ? ? ? ? ?The results of significant diagnostics from this hospitalization (including imaging, microbiology, ancillary and laboratory) are listed below for reference.   ? ? ?Microbiology: ?Recent Results (from the past 240 hour(s))  ?Blood culture (routine x 2)     Status: None (Preliminary result)  ? Collection Time: 12/12/21  2:33 PM  ? Specimen: BLOOD  ?Result Value Ref Range Status  ? Specimen Description BLOOD LEFT ANTECUBITAL  Final  ? Special Requests   Final  ?  BOTTLES DRAWN AEROBIC AND ANAEROBIC Blood Culture adequate volume  ? Culture   Final  ?  NO GROWTH 2 DAYS ?Performed at [facility], [address] [place], [place] [address] ?  ?  Report Status PENDING  Incomplete  ?Blood culture (routine x 2)     Status: None (Preliminary result)  ? Collection Time: 12/12/21  2:50 PM  ? Specimen: BLOOD  ?Result Value Ref Range Status  ? Specimen Description BLOOD BLOOD RIGHT HAND  Final  ? Special Requests   Final  ?  BOTTLES DRAWN AEROBIC AND ANAEROBIC Blood Culture adequate volume  ? Culture   Final  ?  NO GROWTH 2 DAYS ?Performed at [facility], [address] [place], [place] [address] ?  ? Report Status PENDING  Incomplete  ?Urine Culture     Status: Abnormal  ? Collection Time: 12/12/21  4:32 PM  ? Specimen: Urine, Random  ?Result Value Ref Range Status  ? Specimen Description   Final  ?  URINE, RANDOM ?Performed at [facility], [address] [place], [place] [address] ?  ? Special Requests   Final  ?  NONE ?Performed at [facility], [address] [place], [place] [address] ?  ? Culture (A)  Final  ?  >=100,000 COLONIES/mL MULTIPLE SPECIES PRESENT, SUGGEST RECOLLECTION  ? Report Status 12/14/2021 FINAL  Final  ?C Difficile Quick Screen w PCR reflex     Status: None  ? Collection Time: 12/12/21  4:32 PM  ? Specimen: STOOL  ?Result Value Ref Range Status  ? C Diff antigen NEGATIVE NEGATIVE Final  ? C Diff toxin NEGATIVE  NEGATIVE Final  ? C Diff interpretation No C. difficile detected.  Final  ?  Comment: Performed at [facility], [address], [place], [place] [address]  ?  ? ?Labs: ?BNP (last 3 results) ?No resu

## 2021-12-14 NOTE — Assessment & Plan Note (Signed)
D/t n/v/d ?Repleted, sent home on po supplementation ?PCP should recheck BMP ?

## 2021-12-17 LAB — CULTURE, BLOOD (ROUTINE X 2)
Culture: NO GROWTH
Culture: NO GROWTH
Special Requests: ADEQUATE
Special Requests: ADEQUATE

## 2021-12-23 LAB — CALPROTECTIN, FECAL: Calprotectin, Fecal: 312 ug/g — ABNORMAL HIGH (ref 0–120)

## 2022-09-16 IMAGING — CT CT ABD-PELV W/O CM
2 of 4 series · 16 of 46 positions shown, 18 images · non-contrast
Comparison: CT abdomen pelvis dated September 01, 2010

CLINICAL DATA: Abdominal pain



[Series 2: ap without · axial · non-contrast · 0.76mm/px · z∈[+950,+1415]mm · 13 of 105 slices shown, 15 images]
[im 6/105  soft-tissue]
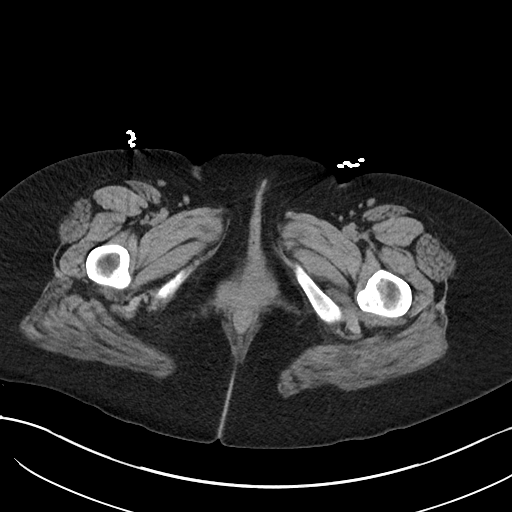
[im 6/105  bone]
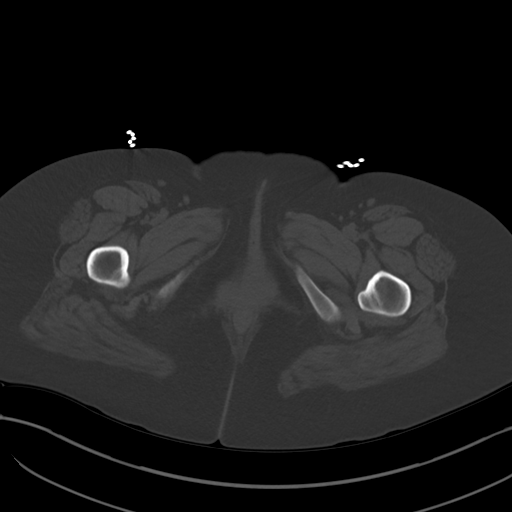
[im 12/105  soft-tissue]
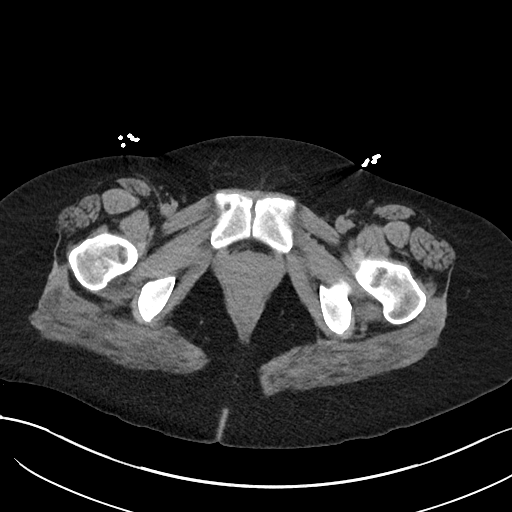
[im 24/105  soft-tissue]
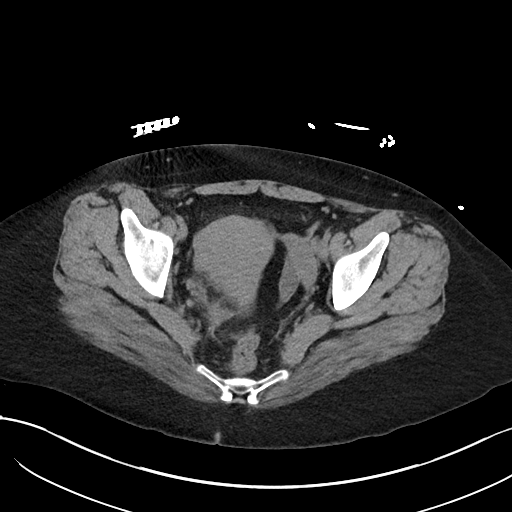
[im 29/105  soft-tissue]
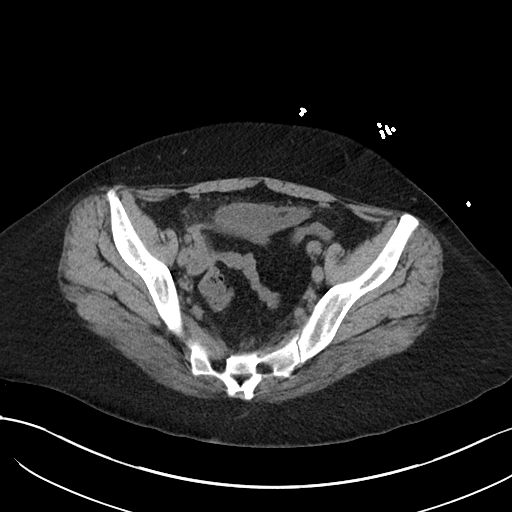
[im 35/105  soft-tissue]
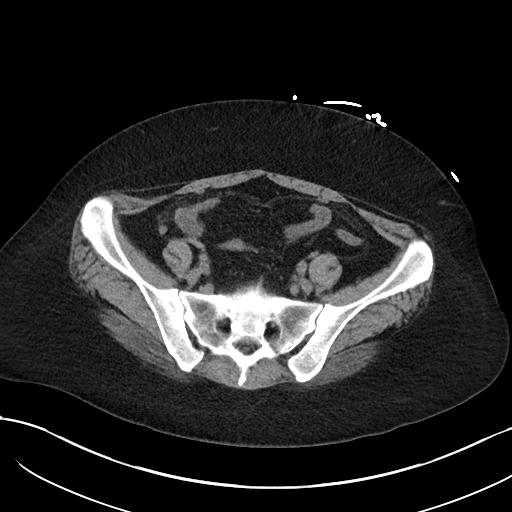
[im 47/105  soft-tissue]
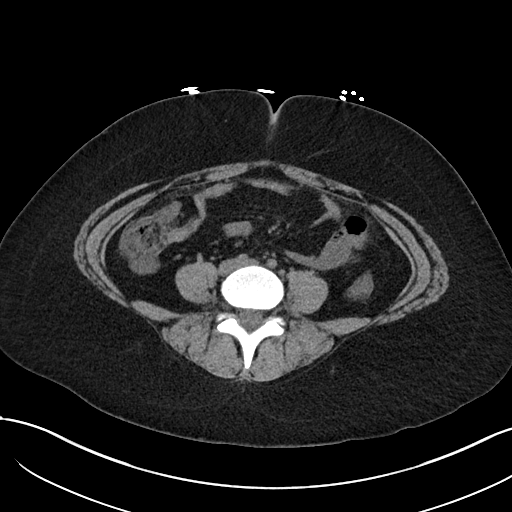
[im 53/105  soft-tissue]
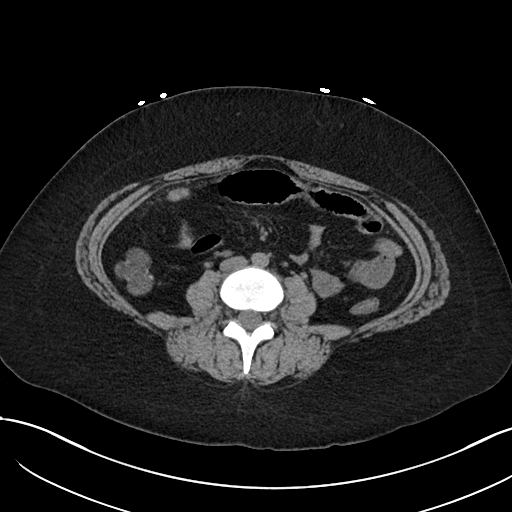
[im 58/105  soft-tissue]
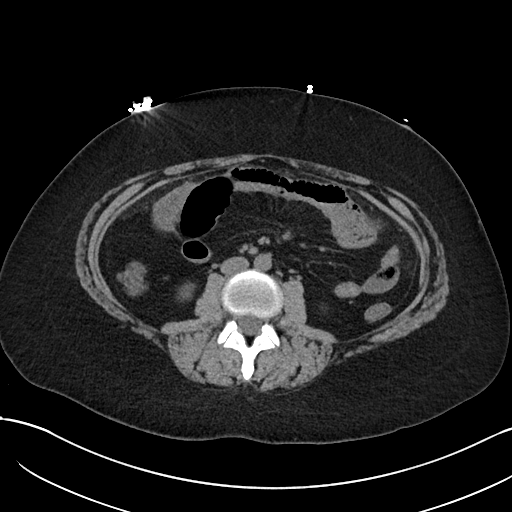
[im 70/105  soft-tissue]
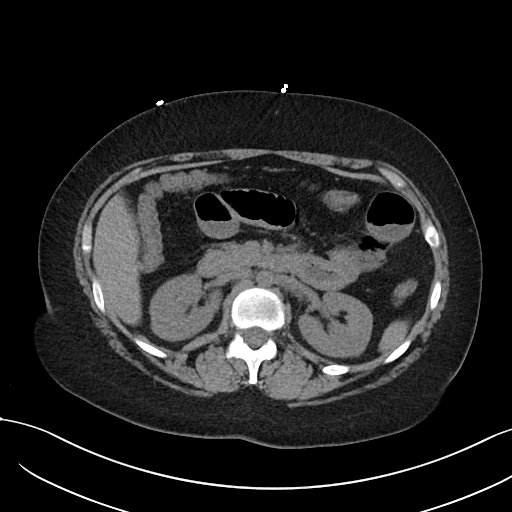
[im 70/105  bone]
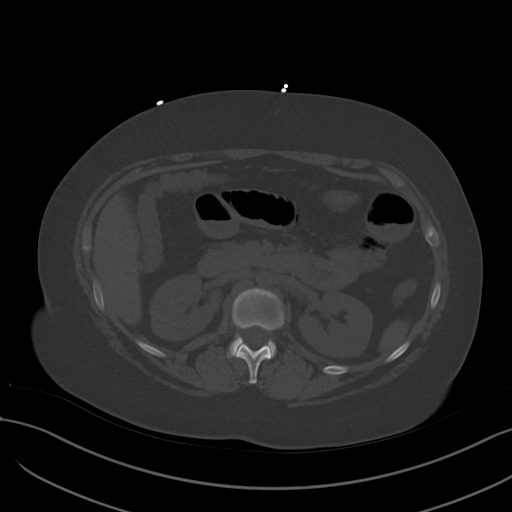
[im 76/105  soft-tissue]
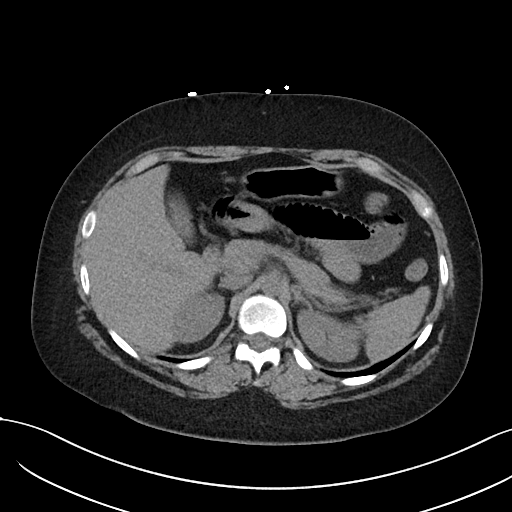
[im 81/105  soft-tissue]
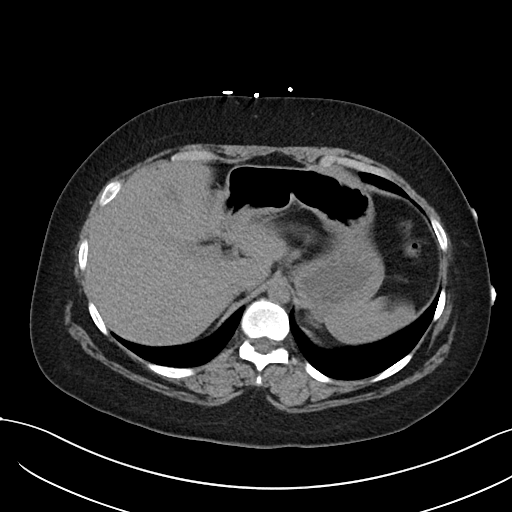
[im 93/105  soft-tissue]
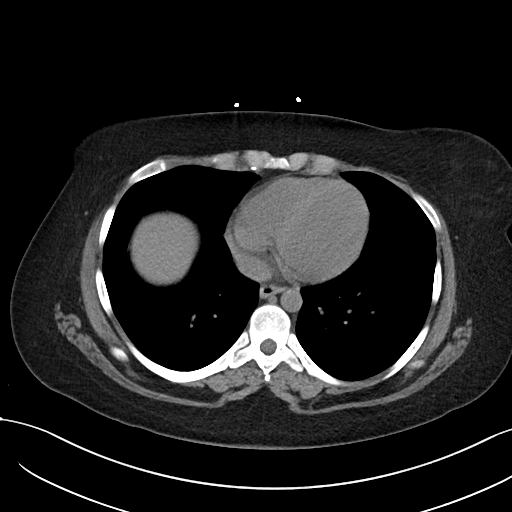
[im 99/105  soft-tissue]
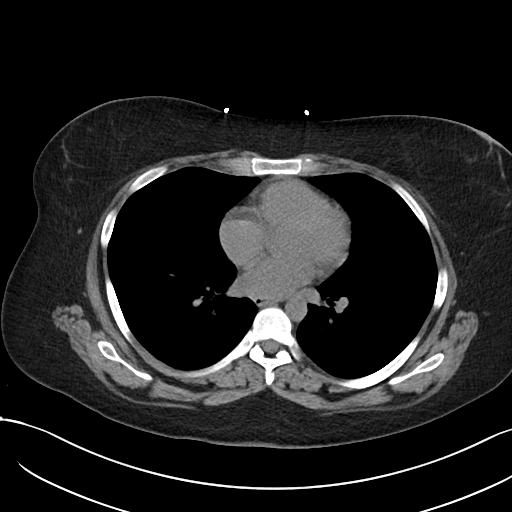

[Series 5: cor · coronal · 0.74mm/px · 3 of 95 slices shown]
[im 32/95  soft-tissue]
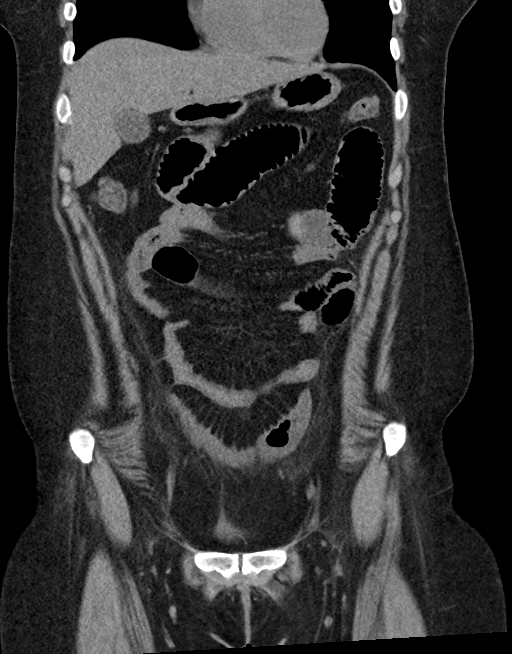
[im 42/95  soft-tissue]
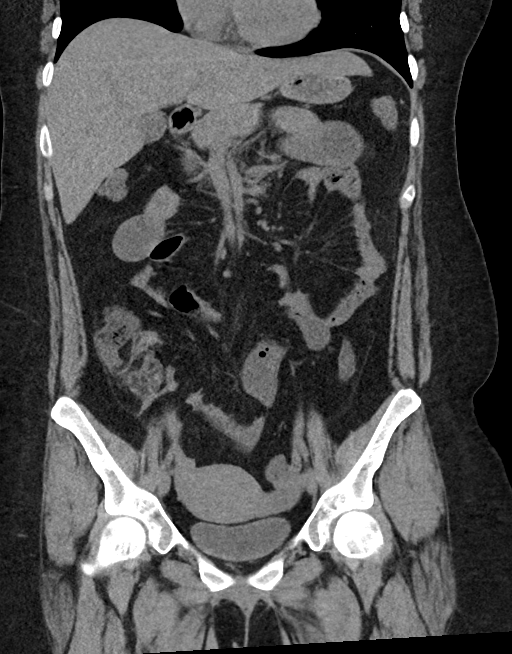
[im 53/95  soft-tissue]
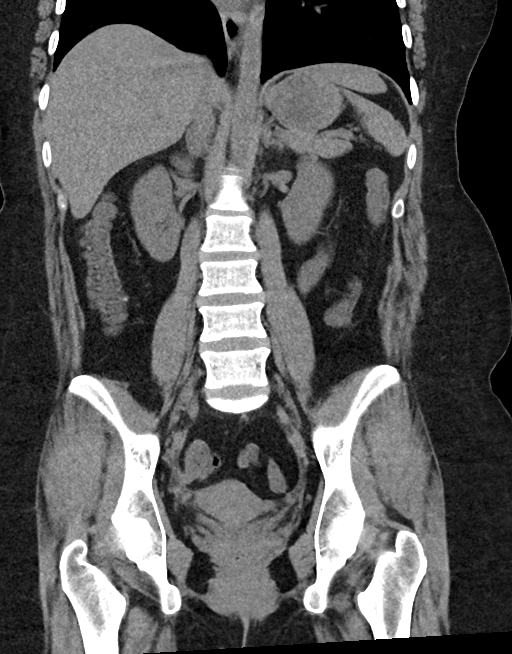

[16 of 46 positions shown; findings below may reference images not displayed]

FINDINGS: Lower chest: No acute abnormality.

Hepatobiliary: No focal liver lesions. Cholelithiasis with no
gallbladder wall thickening. No biliary ductal dilation.

Pancreas: Unremarkable. No pancreatic ductal dilatation or
surrounding inflammatory changes.

Spleen: Normal in size without focal abnormality.

Adrenals/Urinary Tract: Adrenal glands are unremarkable. Kidneys are
normal, without renal calculi, focal lesion, or hydronephrosis.
Bladder is unremarkable.

Stomach/Bowel: Wall thickening of multiple distal small bowel loops
with perienteric fat stranding, and upstream dilation. A transition
point is seen in the right lower quadrant on series 2, image 45.
Stomach is unremarkable. Normal appendix. Colon is decompressed.

Vascular/Lymphatic: No significant vascular findings are present. No
enlarged abdominal or pelvic lymph nodes.

Reproductive: Uterus and bilateral adnexa are unremarkable.

Other: No abdominal wall hernia or abnormality. No abdominopelvic
ascites.

Musculoskeletal: No acute or significant osseous findings.
IMPRESSION: Multiple thick-walled loops of distal small bowel with perienteric
fat stranding and upstream dilation, findings are likely due to
infectious or inflammatory enteritis with secondary small bowel
obstruction.

## 2023-08-04 NOTE — L&D Delivery Note (Signed)
 Delivery Note  First Stage: Labor onset: 1400 Augmentation : pitocin , AROM Analgesia /Anesthesia intrapartum: epidural AROM at 1407  Second Stage: Complete dilation at 1958 Onset of pushing at 1958 FHR second stage 130bpm, moderate variability  Delivery of a viable female infant 12/28/2023 at 2011 by Lavanda Porter, CNM. delivery of fetal head in OA position with no restitution of fetal head and turtling noted. Attempted gentle downward traction with no delivery of fetal shoulders. Notified nursing staff of shoulder dystocia and McRoberts. Palpated fetal shoulders and noted them to be in lateral position. Rubin's II maneuver and rotated right shoulder into anterior position and then shoulders delivered easily with gentle downward traction. Infant delivered LOA. No nuchal cord;  Anterior then posterior shoulders delivered easily with gentle downward traction. Baby placed on mom's chest, and attended to by peds. Shoulder dystocia lasted 45 seconds. Cord double clamped after cessation of pulsation, cut by CNM per patient's request.  Third Stage: Placenta delivered Ileana Mallard intact with 3 VC @ 2016 Placenta disposition: discarded Uterine tone firm / bleeding light  No laceration identified  Anesthesia for repair: none needed Repair none needed Est. Blood Loss (mL):  Complications: 45 second shoulder dystocia  Mom to postpartum.  Baby to Couplet care / Skin to Skin.  Newborn: Birth Weight: TBD, infant skin-to-skin  Apgar Scores: 8, 9 Feeding planned: formula

## 2023-08-22 ENCOUNTER — Encounter: Payer: Self-pay | Admitting: Emergency Medicine

## 2023-08-22 ENCOUNTER — Other Ambulatory Visit: Payer: Self-pay

## 2023-08-22 ENCOUNTER — Emergency Department
Admission: EM | Admit: 2023-08-22 | Discharge: 2023-08-22 | Disposition: A | Discharge status: Home / Self Care | Payer: Medicaid Other | Attending: Emergency Medicine | Admitting: Emergency Medicine

## 2023-08-22 DIAGNOSIS — N390 Urinary tract infection, site not specified: Secondary | ICD-10-CM | POA: Diagnosis not present

## 2023-08-22 DIAGNOSIS — R059 Cough, unspecified: Secondary | ICD-10-CM | POA: Diagnosis present

## 2023-08-22 DIAGNOSIS — Z20822 Contact with and (suspected) exposure to covid-19: Secondary | ICD-10-CM | POA: Insufficient documentation

## 2023-08-22 DIAGNOSIS — J069 Acute upper respiratory infection, unspecified: Secondary | ICD-10-CM | POA: Diagnosis not present

## 2023-08-22 LAB — URINALYSIS, ROUTINE W REFLEX MICROSCOPIC
Bilirubin Urine: NEGATIVE
Glucose, UA: NEGATIVE mg/dL
Hgb urine dipstick: NEGATIVE
Ketones, ur: NEGATIVE mg/dL
Nitrite: NEGATIVE
Protein, ur: 100 mg/dL — AB
Specific Gravity, Urine: 1.021 (ref 1.005–1.030)
pH: 6 (ref 5.0–8.0)

## 2023-08-22 LAB — RESP PANEL BY RT-PCR (RSV, FLU A&B, COVID)  RVPGX2
Influenza A by PCR: NEGATIVE
Influenza B by PCR: NEGATIVE
Resp Syncytial Virus by PCR: NEGATIVE
SARS Coronavirus 2 by RT PCR: NEGATIVE

## 2023-08-22 LAB — POC URINE PREG, ED: Preg Test, Ur: POSITIVE — AB

## 2023-08-22 MED ORDER — NITROFURANTOIN MONOHYD MACRO 100 MG PO CAPS: 100.0000 mg | ORAL_CAPSULE | Freq: Two times a day (BID) | ORAL | 0 refills | Status: AC

## 2023-08-22 NOTE — ED Triage Notes (Signed)
Presents with cough for couple of days  No fever  Also has had some n/v  Thinks she may be pregnant

## 2023-08-22 NOTE — ED Provider Notes (Signed)
   [facility] Provider Note    Event Date/Time   First MD Initiated Contact with Patient 08/22/23 1018     (approximate)  History   Chief Complaint: Cough  HPI  Audrey Beard is a 28 y.o. female with a past medical history of anemia who presents to the emergency department for cough.  According to the patient for the past 4 days or so she has had a cough.  States initially she had some nausea and vomiting but that has since cleared.  Continues to have cough, states she thinks she had a low-grade fever at first but has not had a fever recently.  States her family is all sick with similar symptoms as well.  Physical Exam   Triage Vital Signs: ED Triage Vitals  Encounter Vitals Group     BP 08/22/23 1011 126/64     Systolic BP Percentile --      Diastolic BP Percentile --      Pulse Rate 08/22/23 1011 97     Resp 08/22/23 1011 18     Temp 08/22/23 1011 98.1 F (36.7 C)     Temp Source 08/22/23 1011 Oral     SpO2 08/22/23 1011 99 %     Weight 08/22/23 1003 202 lb 13.2 oz (92 kg)     Height 08/22/23 1003 5\' 3"  (1.6 m)     Head Circumference --      Peak Flow --      Pain Score 08/22/23 1009 0     Pain Loc --      Pain Education --      Exclude from Growth Chart --     Most recent vital signs: Vitals:   08/22/23 1011  BP: 126/64  Pulse: 97  Resp: 18  Temp: 98.1 F (36.7 C)  SpO2: 99%    General: Awake, no distress.  Mild rhinorrhea CV:  Good peripheral perfusion.  Regular rate and rhythm  Resp:  Normal effort.  Equal breath sounds bilaterally.  Abd:  Nontender gravid abdomen  ED Results / Procedures / Treatments   MEDICATIONS ORDERED IN ED: Medications - No data to display   IMPRESSION / MDM / ASSESSMENT AND PLAN / ED COURSE  I reviewed the triage vital signs and the nursing notes.  Patient's presentation is most consistent with acute illness / injury with system symptoms.  Patient presents to the emergency department for cough  congestion initially with nausea and fever but that has since cleared.  Patient states her family is sick with similar symptoms.  She brought her 1 son into the emergency department as his symptoms have been lasting the longest.  Overall patient appears well, mild congestion on exam.  We will check a COVID/flu swab continue to closely monitor.  Patient's urinalysis has resulted showing white cells red cells as well as squamous cells however it also shows white blood cell clumps.  As the patient is pregnant we will cover with antibiotics as precaution, 7 days of Macrobid.  Patient's COVID/flu/RSV is negative.  Suspect likely viral URI.  FINAL CLINICAL IMPRESSION(S) / ED DIAGNOSES   Upper respiratory infection Urinary tract infection  Note:  This document was prepared using Dragon voice recognition software and may include unintentional dictation errors.   Minna Antis, MD 08/22/23 1116

## 2023-10-11 ENCOUNTER — Other Ambulatory Visit: Payer: Self-pay | Admitting: Family Medicine

## 2023-10-11 DIAGNOSIS — Z3482 Encounter for supervision of other normal pregnancy, second trimester: Secondary | ICD-10-CM

## 2023-10-14 DIAGNOSIS — R87612 Low grade squamous intraepithelial lesion on cytologic smear of cervix (LGSIL): Secondary | ICD-10-CM | POA: Insufficient documentation

## 2023-10-27 ENCOUNTER — Observation Stay

## 2023-10-27 ENCOUNTER — Other Ambulatory Visit: Payer: Self-pay

## 2023-10-27 ENCOUNTER — Observation Stay
Admission: EM | Admit: 2023-10-27 | Discharge: 2023-10-27 | Disposition: A | Discharge status: Home / Self Care | Attending: Obstetrics and Gynecology | Admitting: Obstetrics and Gynecology

## 2023-10-27 DIAGNOSIS — A749 Chlamydial infection, unspecified: Secondary | ICD-10-CM | POA: Diagnosis not present

## 2023-10-27 DIAGNOSIS — O99333 Smoking (tobacco) complicating pregnancy, third trimester: Secondary | ICD-10-CM | POA: Diagnosis not present

## 2023-10-27 DIAGNOSIS — O26899 Other specified pregnancy related conditions, unspecified trimester: Principal | ICD-10-CM | POA: Diagnosis present

## 2023-10-27 DIAGNOSIS — O26893 Other specified pregnancy related conditions, third trimester: Principal | ICD-10-CM | POA: Insufficient documentation

## 2023-10-27 DIAGNOSIS — O0933 Supervision of pregnancy with insufficient antenatal care, third trimester: Secondary | ICD-10-CM | POA: Diagnosis not present

## 2023-10-27 DIAGNOSIS — R1030 Lower abdominal pain, unspecified: Secondary | ICD-10-CM | POA: Insufficient documentation

## 2023-10-27 DIAGNOSIS — F17210 Nicotine dependence, cigarettes, uncomplicated: Secondary | ICD-10-CM | POA: Diagnosis not present

## 2023-10-27 DIAGNOSIS — B9689 Other specified bacterial agents as the cause of diseases classified elsewhere: Secondary | ICD-10-CM | POA: Insufficient documentation

## 2023-10-27 DIAGNOSIS — O98213 Gonorrhea complicating pregnancy, third trimester: Secondary | ICD-10-CM | POA: Insufficient documentation

## 2023-10-27 DIAGNOSIS — Z79899 Other long term (current) drug therapy: Secondary | ICD-10-CM | POA: Diagnosis not present

## 2023-10-27 DIAGNOSIS — O23593 Infection of other part of genital tract in pregnancy, third trimester: Secondary | ICD-10-CM | POA: Diagnosis not present

## 2023-10-27 DIAGNOSIS — O0993 Supervision of high risk pregnancy, unspecified, third trimester: Secondary | ICD-10-CM | POA: Insufficient documentation

## 2023-10-27 DIAGNOSIS — O98219 Gonorrhea complicating pregnancy, unspecified trimester: Secondary | ICD-10-CM | POA: Diagnosis present

## 2023-10-27 DIAGNOSIS — O98313 Other infections with a predominantly sexual mode of transmission complicating pregnancy, third trimester: Secondary | ICD-10-CM | POA: Diagnosis not present

## 2023-10-27 LAB — URINE DRUG SCREEN, QUALITATIVE (ARMC ONLY)
Amphetamines, Ur Screen: NOT DETECTED
Barbiturates, Ur Screen: NOT DETECTED
Benzodiazepine, Ur Scrn: NOT DETECTED
Cannabinoid 50 Ng, Ur ~~LOC~~: POSITIVE — AB
Cocaine Metabolite,Ur ~~LOC~~: NOT DETECTED
MDMA (Ecstasy)Ur Screen: NOT DETECTED
Methadone Scn, Ur: NOT DETECTED
Opiate, Ur Screen: NOT DETECTED
Phencyclidine (PCP) Ur S: NOT DETECTED
Tricyclic, Ur Screen: NOT DETECTED

## 2023-10-27 LAB — CHLAMYDIA/NGC RT PCR (ARMC ONLY)
Chlamydia Tr: DETECTED — AB
N gonorrhoeae: DETECTED — AB

## 2023-10-27 LAB — TYPE AND SCREEN
ABO/RH(D): AB POS
Antibody Screen: NEGATIVE

## 2023-10-27 LAB — RUPTURE OF MEMBRANE (ROM)PLUS: Rom Plus: NEGATIVE

## 2023-10-27 LAB — CBC
HCT: 29.7 % — ABNORMAL LOW (ref 36.0–46.0)
Hemoglobin: 10 g/dL — ABNORMAL LOW (ref 12.0–15.0)
MCH: 30.6 pg (ref 26.0–34.0)
MCHC: 33.7 g/dL (ref 30.0–36.0)
MCV: 90.8 fL (ref 80.0–100.0)
Platelets: 247 10*3/uL (ref 150–400)
RBC: 3.27 MIL/uL — ABNORMAL LOW (ref 3.87–5.11)
RDW: 13.2 % (ref 11.5–15.5)
WBC: 14.2 10*3/uL — ABNORMAL HIGH (ref 4.0–10.5)
nRBC: 0 % (ref 0.0–0.2)

## 2023-10-27 LAB — URINALYSIS, COMPLETE (UACMP) WITH MICROSCOPIC
Bilirubin Urine: NEGATIVE
Glucose, UA: NEGATIVE mg/dL
Ketones, ur: 20 mg/dL — AB
Nitrite: NEGATIVE
Protein, ur: NEGATIVE mg/dL
Specific Gravity, Urine: 1.01 (ref 1.005–1.030)
pH: 7 (ref 5.0–8.0)

## 2023-10-27 LAB — WET PREP, GENITAL
Sperm: NONE SEEN
Trich, Wet Prep: NONE SEEN
WBC, Wet Prep HPF POC: <10 (ref <10)
Yeast Wet Prep HPF POC: NONE SEEN

## 2023-10-27 LAB — FETAL FIBRONECTIN: Fetal Fibronectin: POSITIVE — AB

## 2023-10-27 MED ORDER — CEFTRIAXONE SODIUM 500 MG IJ SOLR
500.0000 mg | Freq: Once | INTRAMUSCULAR | Status: DC
  Filled 2023-10-27: qty 500

## 2023-10-27 MED ORDER — ACETAMINOPHEN 500 MG PO TABS
1000.0000 mg | ORAL_TABLET | Freq: Four times a day (QID) | ORAL | Status: DC | PRN
Start: 2023-10-27 — End: 2023-10-27
  Administered 2023-10-27: 1000 mg via ORAL
  Filled 2023-10-27: qty 2

## 2023-10-27 MED ORDER — SODIUM CHLORIDE 0.9 % IV SOLN
2.0000 g | Freq: Once | INTRAVENOUS | Status: AC
  Administered 2023-10-27: 2 g via INTRAVENOUS
  Filled 2023-10-27: qty 20

## 2023-10-27 MED ORDER — METRONIDAZOLE 500 MG/100ML IV SOLN
500.0000 mg | Freq: Two times a day (BID) | INTRAVENOUS | Status: DC
  Administered 2023-10-27: 500 mg via INTRAVENOUS
  Filled 2023-10-27: qty 100

## 2023-10-27 MED ORDER — METRONIDAZOLE 500 MG PO TABS
500.0000 mg | ORAL_TABLET | Freq: Two times a day (BID) | ORAL | 0 refills | Status: AC
  Filled 2023-10-27: qty 14, 7d supply, fill #0

## 2023-10-27 MED ORDER — ONDANSETRON 4 MG PO TBDP
4.0000 mg | ORAL_TABLET | Freq: Three times a day (TID) | ORAL | 0 refills | Status: DC | PRN
  Filled 2023-10-27: qty 20, 7d supply, fill #0

## 2023-10-27 MED ORDER — ONDANSETRON 4 MG PO TBDP
4.0000 mg | ORAL_TABLET | Freq: Three times a day (TID) | ORAL | Status: DC | PRN
  Administered 2023-10-27: 4 mg via ORAL
  Filled 2023-10-27: qty 1

## 2023-10-27 MED ORDER — LACTATED RINGERS IV BOLUS
1000.0000 mL | Freq: Once | INTRAVENOUS | Status: AC
  Administered 2023-10-27: 1000 mL via INTRAVENOUS

## 2023-10-27 MED ORDER — AZITHROMYCIN 500 MG PO TABS
1000.0000 mg | ORAL_TABLET | Freq: Once | ORAL | Status: AC
  Administered 2023-10-27: 1000 mg via ORAL
  Filled 2023-10-27: qty 2

## 2023-10-27 MED ORDER — CALCIUM CARBONATE ANTACID 500 MG PO CHEW: 2.0000 | CHEWABLE_TABLET | ORAL | Status: DC | PRN

## 2023-10-27 NOTE — Progress Notes (Signed)
 Pt given discharge instructions including follow up care, preterm labor, and prescriptions. Pt given meds to beds. Pt verbalized understanding.

## 2023-10-27 NOTE — Discharge Summary (Signed)
 Audrey Beard is a 28 y.o. female. She is at [redacted]w[redacted]d gestation. Patient's last menstrual period was 03/27/2023 (within days). 01/01/2024, by Last Menstrual Period   Prenatal care site: Audrey Beard  Chief complaint: cramping and contractions   Admission Diagnoses:  1) intrauterine pregnancy at [redacted]w[redacted]d  2) Abdominal pain affecting pregnancy [O26.899, R10.9]  Discharge Diagnoses:  Principal Problem:   Abdominal pain affecting pregnancy Active Problems:   Chlamydia infection affecting pregnancy   Gonorrhea affecting pregnancy   Limited prenatal care in third trimester   Bacterial vaginosis in pregnancy  HPI: Audrey Beard presents to L&D with complaints of lower abdominal pain and cramping. States she was walking around more than usual yesterday. Pain started last night and then got worse around 0300 today. States she's having lower abdominal cramping, right sided back pain. Also reports vaginal discharge but no odor or itching. Her pregnancy is complicated by late prenatal care with one visit 3 weeks ago, history of preeclampsia, history of depression/anxiety, hx of HSV  .  She denies Loss of fluid or Vaginal bleeding. Endorses fetal movement as active.   S: Resting comfortably, no VB.no LOF,  Active fetal movement.   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    Past Surgical Hx:  has a past surgical history that includes No past surgeries.   No Known Allergies   Prior to Admission medications   Medication Sig Start Date End Date Taking? Authorizing Provider  ondansetron (ZOFRAN) 4 MG tablet Take 1 tablet (4 mg total) by mouth every 8 (eight) hours as needed for nausea. 12/14/21   Audrey Nielsen, DO    Social History: She  reports that she has  been smoking cigarettes. She has a 1 pack-year smoking history. She has never used smokeless tobacco. She reports that she does not drink alcohol and does not use drugs.  Family History: family history is not on file.   Review of Systems: A full review of systems was performed and negative except as noted in the HPI.     Pertinent Results:   O:  BP 121/67   Pulse 96   Temp 98 F (36.7 C) (Oral)   Resp 17   LMP 03/27/2023 (Within Days)  Results for orders placed or performed during the hospital encounter of 10/27/23 (from the past 48 hours)  Wet prep, genital   Collection Time: 10/27/23  9:58 AM  Result Value Ref Range   Yeast Wet Prep HPF POC NONE SEEN NONE SEEN   Trich, Wet Prep NONE SEEN NONE SEEN   Clue Cells Wet Prep HPF POC PRESENT (A) NONE SEEN   WBC, Wet Prep HPF POC <10 <10   Sperm NONE SEEN   Chlamydia/NGC rt PCR ([facility] only)   Collection Time: 10/27/23  9:58 AM   Specimen: GU  Result Value Ref Range   Specimen source GC/Chlam ENDOCERVICAL    Chlamydia Tr DETECTED (A) NOT DETECTED   N gonorrhoeae DETECTED (A) NOT DETECTED  Fetal fibronectin   Collection Time: 10/27/23  9:58 AM  Result Value Ref Range   Fetal Fibronectin POSITIVE (A) NEGATIVE  Urinalysis, Complete w Microscopic -Urine, Clean Catch   Collection Time: 10/27/23  9:58 AM  Result Value Ref Range   Color, Urine YELLOW (A) YELLOW   APPearance HAZY (A) CLEAR   Specific  Gravity, Urine 1.010 1.005 - 1.030   pH 7.0 5.0 - 8.0   Glucose, UA NEGATIVE NEGATIVE mg/dL   Hgb urine dipstick MODERATE (A) NEGATIVE   Bilirubin Urine NEGATIVE NEGATIVE   Ketones, ur 20 (A) NEGATIVE mg/dL   Protein, ur NEGATIVE NEGATIVE mg/dL   Nitrite NEGATIVE NEGATIVE   Leukocytes,Ua TRACE (A) NEGATIVE   RBC / HPF 21-50 0 - 5 RBC/hpf   WBC, UA 6-10 0 - 5 WBC/hpf   Bacteria, UA RARE (A) NONE SEEN   Squamous Epithelial / HPF 0-5 0 - 5 /HPF   Mucus PRESENT    Amorphous Crystal PRESENT   Urine Drug Screen, Qualitative ([facility] only)    Collection Time: 10/27/23  9:58 AM  Result Value Ref Range   Tricyclic, Ur Screen NONE DETECTED NONE DETECTED   Amphetamines, Ur Screen NONE DETECTED NONE DETECTED   MDMA (Ecstasy)Ur Screen NONE DETECTED NONE DETECTED   Cocaine Metabolite,Ur [place] NONE DETECTED NONE DETECTED   Opiate, Ur Screen NONE DETECTED NONE DETECTED   Phencyclidine (PCP) Ur S NONE DETECTED NONE DETECTED   Cannabinoid 50 Ng, Ur [place] POSITIVE (A) NONE DETECTED   Barbiturates, Ur Screen NONE DETECTED NONE DETECTED   Benzodiazepine, Ur Scrn NONE DETECTED NONE DETECTED   Methadone Scn, Ur NONE DETECTED NONE DETECTED  Rupture of Membrane (ROM) Plus   Collection Time: 10/27/23  9:58 AM  Result Value Ref Range   Rom Plus NEGATIVE   CBC   Collection Time: 10/27/23 12:29 PM  Result Value Ref Range   WBC 14.2 (H) 4.0 - 10.5 K/uL   RBC 3.27 (L) 3.87 - 5.11 MIL/uL   Hemoglobin 10.0 (L) 12.0 - 15.0 g/dL   HCT 82.9 (L) 56.2 - 13.0 %   MCV 90.8 80.0 - 100.0 fL   MCH 30.6 26.0 - 34.0 pg   MCHC 33.7 30.0 - 36.0 g/dL   RDW 86.5 78.4 - 69.6 %   Platelets 247 150 - 400 K/uL   nRBC 0.0 0.0 - 0.2 %  Type and screen [facility]   Collection Time: 10/27/23 12:29 PM  Result Value Ref Range   ABO/RH(D) AB POS    Antibody Screen NEG    Sample Expiration      10/30/2023,2359 Performed at [facility], [address], [place], [place] [address]      [place] [employer] Result Date: 10/27/2023 CLINICAL DATA:  Limited prenatal care. Pre term contractions, abdominal pain EXAM: LIMITED OBSTETRIC ULTRASOUND COMPARISON:  None Available. FINDINGS: Number of Fetuses: 1 Heart Rate:  132 bpm Movement: Yes Presentation: Cephalic Placental Location: Fundal Previa: No Amniotic Fluid (Subjective):  Within normal limits. AFI: 19 cm BPD: 7.3 cm 29 w  3 d MATERNAL FINDINGS: Cervix:  Appears closed.  Length 4.9 cm. Uterus/Adnexae: No abnormality visualized. IMPRESSION: Approximately 29 week 3 day intrauterine pregnancy. Fetal  heart rate 132 beats per minute. No acute maternal findings. Cervical length 4.9 cm. This exam is performed on an emergent basis and does not comprehensively evaluate fetal size, dating, or anatomy; follow-up complete OB US should be considered if further fetal assessment is warranted. Electronically Signed   By: Charlett Nose M.D.   On: 10/27/2023 11:31    Constitutional: NAD, AAOx3  PULM: nl respiratory effort Abd: gravid, non-tender Ext: Non-tender, Nonedmeatous Psych: mood appropriate, speech normal Pelvic :  moderate amount of thin yellow, green discharge present, mild erythema SVE: Dilation: Closed (Outer oss is 1cm, inner oss is closed) Effacement (%): Thick Station: -  3 Exam by::  Tami Lin, CNM)    NST: Baseline FHR: 125 beats/min Variability: moderate Accelerations: present Decelerations: absent Tocometry: Occasional, mild contractions  Time: at least 20 minutes   Interpretation: Category I INDICATIONS: rule out uterine contractions RESULTS:  A NST procedure was performed with FHR monitoring and a normal baseline established, appropriate time of 20-40 minutes of evaluation, and accels >2 seen w 15x15 characteristics.  Results show a REACTIVE NST.   Consults: None  Procedures: NST/US  Hospital Course: The patient was admitted to the Labor and Delivery Triage for monitoring. A pelvic examination revealed a long cervix, with the external os measuring 1 cm while the internal os remained closed. A limited obstetric ultrasound was performed, indicating a cephalic presentation and a fundal placenta, with a cervical length of 4.9 cm. Laboratory results showed a positive screen for chlamydia and gonorrhea, and the wet prep indicated bacterial vaginosis. The fetal fibronectin (FFN) test returned positive. She received treatment with Azithromycin, Rocephin, and Flagyl. After an intravenous fluid bolus, her contractions subsided, and Alenna reported an improvement in her symptoms. We  discussed the implications of a positive FFN in the context of her long cervix and her history of three term deliveries. It was noted that while a negative FFN is generally reassuring, a positive result is less sensitive. It is encouraging that her cervical length exceeds 3 cm and that her contractions resolved with minimal intervention. She was treated for active vaginal infections and advised to refrain from intercourse for at least 7 days, with the recommendation that her partner also receive treatment. An anatomy ultrasound is scheduled at [facility] on November 03, 2023, followed by NOB transfer appointment. Strict preterm labor precautions were reviewed. She was deemed stable for discharge and further outpatient management.    Discharge Condition: stable  Disposition: Discharge disposition: 01-Home or Self Care        Allergies as of 10/27/2023   No Known Allergies      Medication List     STOP taking these medications    ondansetron 4 MG tablet Commonly known as: ZOFRAN       TAKE these medications    metroNIDAZOLE 500 MG tablet Commonly known as: Flagyl Take 1 tablet (500 mg total) by mouth 2 (two) times daily for 7 days. Start taking on: October 28, 2023   ondansetron 4 MG disintegrating tablet Commonly known as: ZOFRAN-ODT Take 1 tablet (4 mg total) by mouth every 8 (eight) hours as needed for nausea or vomiting.        Follow-up Information     [facility]. Go on 11/03/2023.   Why: at 1:30 pm for anatomy US Contact information: [address] [facility] [place] [address] [phone]        Gustavo Lah, CNM. Go on 11/03/2023.   Specialty: Certified Nurse Midwife Why: at 3pm for prenatal appointment Contact information: [address] [place] [address] [phone]                ----- Gustavo Lah, CNM  Certified Nurse Midwife [place]  [facility]

## 2023-10-27 NOTE — Discharge Instructions (Signed)

## 2023-10-27 NOTE — OB Triage Note (Signed)
 28 y.o G6P5 presents to L&D triage for cramping/ctx's. EDD is unsure as chart documents 03/20/2024 but pt reports being further along and reports LMP as 03/27/2023. She states that she had abdominal pain last night but her pain has worsened since 0300 and she reports having more consistent cramping and ctx's in her lower abdomen. She reports nausea/vomiting, denies diarrhea, denies vaginal bleeding, reports fetal movement. [facility] CNM aware of pt arrival. Monitors applied and assessing, vitals wnl. Records from [facility] will be requested.

## 2023-10-28 LAB — URINE CULTURE: Culture: NO GROWTH

## 2023-10-28 LAB — RPR: RPR Ser Ql: NONREACTIVE

## 2023-11-03 DIAGNOSIS — O99320 Drug use complicating pregnancy, unspecified trimester: Secondary | ICD-10-CM | POA: Insufficient documentation

## 2023-11-18 ENCOUNTER — Ambulatory Visit: Admission: RE | Admit: 2023-11-18 | Source: Ambulatory Visit

## 2023-11-18 DIAGNOSIS — O26843 Uterine size-date discrepancy, third trimester: Secondary | ICD-10-CM | POA: Insufficient documentation

## 2023-11-18 DIAGNOSIS — Z720 Tobacco use: Secondary | ICD-10-CM | POA: Insufficient documentation

## 2023-11-18 DIAGNOSIS — O99013 Anemia complicating pregnancy, third trimester: Secondary | ICD-10-CM | POA: Diagnosis present

## 2023-11-18 LAB — OB RESULTS CONSOLE HIV ANTIBODY (ROUTINE TESTING): HIV: NONREACTIVE

## 2023-11-26 ENCOUNTER — Encounter: Payer: Self-pay | Admitting: Obstetrics and Gynecology

## 2023-12-03 ENCOUNTER — Encounter: Payer: Self-pay | Admitting: Obstetrics and Gynecology

## 2023-12-03 ENCOUNTER — Observation Stay
Admission: EM | Admit: 2023-12-03 | Discharge: 2023-12-03 | Disposition: A | Discharge status: Home / Self Care | Attending: Certified Nurse Midwife | Admitting: Certified Nurse Midwife

## 2023-12-03 ENCOUNTER — Other Ambulatory Visit: Payer: Self-pay

## 2023-12-03 DIAGNOSIS — O26893 Other specified pregnancy related conditions, third trimester: Principal | ICD-10-CM | POA: Insufficient documentation

## 2023-12-03 DIAGNOSIS — B9689 Other specified bacterial agents as the cause of diseases classified elsewhere: Secondary | ICD-10-CM | POA: Diagnosis not present

## 2023-12-03 DIAGNOSIS — A749 Chlamydial infection, unspecified: Secondary | ICD-10-CM | POA: Insufficient documentation

## 2023-12-03 DIAGNOSIS — O23593 Infection of other part of genital tract in pregnancy, third trimester: Secondary | ICD-10-CM | POA: Diagnosis not present

## 2023-12-03 DIAGNOSIS — O09299 Supervision of pregnancy with other poor reproductive or obstetric history, unspecified trimester: Principal | ICD-10-CM

## 2023-12-03 DIAGNOSIS — Z3A36 36 weeks gestation of pregnancy: Secondary | ICD-10-CM | POA: Diagnosis not present

## 2023-12-03 DIAGNOSIS — O133 Gestational [pregnancy-induced] hypertension without significant proteinuria, third trimester: Secondary | ICD-10-CM | POA: Diagnosis not present

## 2023-12-03 DIAGNOSIS — R102 Pelvic and perineal pain: Secondary | ICD-10-CM | POA: Insufficient documentation

## 2023-12-03 DIAGNOSIS — O98813 Other maternal infectious and parasitic diseases complicating pregnancy, third trimester: Secondary | ICD-10-CM | POA: Insufficient documentation

## 2023-12-03 DIAGNOSIS — O26899 Other specified pregnancy related conditions, unspecified trimester: Secondary | ICD-10-CM | POA: Diagnosis present

## 2023-12-03 DIAGNOSIS — O0933 Supervision of pregnancy with insufficient antenatal care, third trimester: Secondary | ICD-10-CM | POA: Insufficient documentation

## 2023-12-03 LAB — URINALYSIS, COMPLETE (UACMP) WITH MICROSCOPIC
Bilirubin Urine: NEGATIVE
Glucose, UA: NEGATIVE mg/dL
Hgb urine dipstick: NEGATIVE
Ketones, ur: 5 mg/dL — AB
Leukocytes,Ua: NEGATIVE
Nitrite: NEGATIVE
Protein, ur: NEGATIVE mg/dL
Specific Gravity, Urine: 1.019 (ref 1.005–1.030)
pH: 6 (ref 5.0–8.0)

## 2023-12-03 LAB — WET PREP, GENITAL
Clue Cells Wet Prep HPF POC: NONE SEEN
Sperm: NONE SEEN
Trich, Wet Prep: NONE SEEN
WBC, Wet Prep HPF POC: <10 (ref <10)
Yeast Wet Prep HPF POC: NONE SEEN

## 2023-12-03 MED ORDER — ACETAMINOPHEN 325 MG PO TABS
650.0000 mg | ORAL_TABLET | Freq: Four times a day (QID) | ORAL | Status: DC | PRN
  Administered 2023-12-03: 650 mg via ORAL
  Filled 2023-12-03: qty 2

## 2023-12-03 NOTE — Discharge Summary (Signed)
 Audrey Beard is a 28 y.o. female. She is at [redacted]w[redacted]d gestation. Patient's last menstrual period was 03/27/2023 (within days). Estimated Date of Delivery: 01/01/24  Prenatal care site: [facility]   Current pregnancy complicated by:  Chlamydia infection affecting pregnancy   Gonorrhea affecting pregnancy   Limited prenatal care in third trimester   Bacterial vaginosis in pregnanc  Chief complaint: pelvic pain  She reports pelvic pain that started today and radiates into her thighs, back, and pelvis. She denies any contractions. She denies vaginal discharge or UTI symptoms or bleeding or leaking fluid. She has not attempted to take any medication to alleviate her discomfort. She endorses good fetal movement.  S: Resting comfortably. no CTX, no VB.no LOF,  Active fetal movement.  Denies: HA, visual changes, SOB, or RUQ/epigastric pain  Maternal Medical History:   Past Medical History:  Diagnosis Date   AKI (acute kidney injury) (HCC) 12/13/2021   Anemia    Depression    Moderate recurrent major depression (HCC) 09/24/2017   Pregnancy induced hypertension    Sepsis (HCC) 12/12/2021   Tobacco use disorder 05/17/2018    Past Surgical History:  Procedure Laterality Date   NO PAST SURGERIES      No Known Allergies  Prior to Admission medications   Medication Sig Start Date End Date Taking? Authorizing Provider  ondansetron  (ZOFRAN -ODT) 4 MG disintegrating tablet Take 1 tablet (4 mg total) by mouth every 8 (eight) hours as needed for nausea or vomiting. 10/27/23   Teodora Fell, CNM    Social History: She  reports that she has been smoking cigarettes. She has a 1 pack-year smoking history. She has never used smokeless tobacco. She reports that she does not drink alcohol and does not use drugs.  Family History: family history is not on file.  no history of gyn cancers  Review of Systems: A full review of systems was performed and negative except as noted in the HPI.     O:  BP  125/76   Pulse (!) 110   Temp 98.7 F (37.1 C) (Oral)   Resp 18   Ht 5\' 3"  (1.6 m)   LMP 03/27/2023 (Within Days)   BMI 35.93 kg/m  Results for orders placed or performed during the hospital encounter of 12/03/23 (from the past 48 hours)  Wet prep, genital   Collection Time: 12/03/23 10:10 PM  Result Value Ref Range   Yeast Wet Prep HPF POC NONE SEEN NONE SEEN   Trich, Wet Prep NONE SEEN NONE SEEN   Clue Cells Wet Prep HPF POC NONE SEEN NONE SEEN   WBC, Wet Prep HPF POC <10 <10   Sperm NONE SEEN   Urinalysis, Complete w Microscopic -Urine, Clean Catch   Collection Time: 12/03/23 10:10 PM  Result Value Ref Range   Color, Urine AMBER (A) YELLOW   APPearance CLOUDY (A) CLEAR   Specific Gravity, Urine 1.019 1.005 - 1.030   pH 6.0 5.0 - 8.0   Glucose, UA NEGATIVE NEGATIVE mg/dL   Hgb urine dipstick NEGATIVE NEGATIVE   Bilirubin Urine NEGATIVE NEGATIVE   Ketones, ur 5 (A) NEGATIVE mg/dL   Protein, ur NEGATIVE NEGATIVE mg/dL   Nitrite NEGATIVE NEGATIVE   Leukocytes,Ua NEGATIVE NEGATIVE   RBC / HPF 0-5 0 - 5 RBC/hpf   WBC, UA 11-20 0 - 5 WBC/hpf   Bacteria, UA MANY (A) NONE SEEN   Squamous Epithelial / HPF 11-20 0 - 5 /HPF   Mucus PRESENT  Ca Oxalate Crys, UA PRESENT      Constitutional: NAD, AAOx3  HE/ENT: extraocular movements grossly intact, moist mucous membranes CV: RRR PULM: nl respiratory effort, CTABL     Abd: gravid, non-tender, non-distended, soft      Ext: Non-tender, Nonedematous   Psych: mood appropriate, speech normal Pelvic: cervix closed/thick/high  Pelvic exam: normal external genitalia, vulva, vagina, cervix, uterus and adnexa.  Fetal  monitoring: Cat 1 Appropriate for GA Baseline: 135bpm Variability: moderate Accelerations: present x >2 Decelerations absent Contractions rare, > apart  A/P: 28 y.o. [redacted]w[redacted]d here for antenatal surveillance for pelvic pain in pregnancy  Principle Diagnosis:  Pelvic pain in pregnancy  Labor: not present. Cervix  closed, rare contractions, she denies feeling any contractions, no need to repeat cervical exam prior to d/c. Fetal Wellbeing: Reassuring Cat 1 tracing. Difficulty tracing d/t fetal movement, overall reassuring. Pelvic pain: offered Tylenol  but she declined. Wet prep and UA negative. She reports her pelvic and back pain resolved after resting for 2hrs. D/c home stable, precautions reviewed, follow-up as scheduled.    Auston Left, CNM 12/03/2023 11:01 PM

## 2023-12-03 NOTE — OB Triage Note (Signed)
 Pt states that she has felt pressure all day long in her thighs, back, and pelvis. Pt states that she has not taken anything for pain at this time.

## 2023-12-10 LAB — OB RESULTS CONSOLE GBS: GBS: POSITIVE

## 2023-12-17 ENCOUNTER — Other Ambulatory Visit: Payer: Self-pay | Admitting: Certified Nurse Midwife

## 2023-12-17 DIAGNOSIS — O99019 Anemia complicating pregnancy, unspecified trimester: Secondary | ICD-10-CM | POA: Insufficient documentation

## 2023-12-17 DIAGNOSIS — D509 Iron deficiency anemia, unspecified: Secondary | ICD-10-CM

## 2023-12-17 NOTE — Progress Notes (Signed)
 S: Patient has no complaints. Denies dizziness/lightheadedness. Reports she has not been taking her iron supplements (ferrous sulfate  325 mg PO) MWF consistently.   O: Hgb: 9.5 (12/10/2023) , Ferritin:5 (12/10/2023).  A: Iron deficiency Anemia in pregnancy   P: Administer iron sucrose 300 mg x 1 dose Plan to deliver 39-40.6w.  Plan of care reviewed with patient. Patient verbalized understanding (12/17/2023).

## 2023-12-26 ENCOUNTER — Other Ambulatory Visit: Payer: Self-pay

## 2023-12-26 ENCOUNTER — Observation Stay
Admission: EM | Admit: 2023-12-26 | Discharge: 2023-12-26 | Disposition: A | Discharge status: Home / Self Care | Attending: Obstetrics and Gynecology | Admitting: Obstetrics and Gynecology

## 2023-12-26 ENCOUNTER — Encounter: Payer: Self-pay | Admitting: Obstetrics and Gynecology

## 2023-12-26 DIAGNOSIS — O09299 Supervision of pregnancy with other poor reproductive or obstetric history, unspecified trimester: Secondary | ICD-10-CM

## 2023-12-26 DIAGNOSIS — D509 Iron deficiency anemia, unspecified: Secondary | ICD-10-CM

## 2023-12-26 DIAGNOSIS — Z79899 Other long term (current) drug therapy: Secondary | ICD-10-CM | POA: Insufficient documentation

## 2023-12-26 DIAGNOSIS — O26893 Other specified pregnancy related conditions, third trimester: Principal | ICD-10-CM | POA: Insufficient documentation

## 2023-12-26 DIAGNOSIS — O99283 Endocrine, nutritional and metabolic diseases complicating pregnancy, third trimester: Secondary | ICD-10-CM | POA: Insufficient documentation

## 2023-12-26 DIAGNOSIS — Z87891 Personal history of nicotine dependence: Secondary | ICD-10-CM | POA: Insufficient documentation

## 2023-12-26 DIAGNOSIS — O99013 Anemia complicating pregnancy, third trimester: Secondary | ICD-10-CM | POA: Diagnosis not present

## 2023-12-26 DIAGNOSIS — A749 Chlamydial infection, unspecified: Secondary | ICD-10-CM

## 2023-12-26 DIAGNOSIS — O99320 Drug use complicating pregnancy, unspecified trimester: Secondary | ICD-10-CM

## 2023-12-26 DIAGNOSIS — B9689 Other specified bacterial agents as the cause of diseases classified elsewhere: Secondary | ICD-10-CM

## 2023-12-26 DIAGNOSIS — M549 Dorsalgia, unspecified: Secondary | ICD-10-CM | POA: Insufficient documentation

## 2023-12-26 DIAGNOSIS — O0933 Supervision of pregnancy with insufficient antenatal care, third trimester: Secondary | ICD-10-CM

## 2023-12-26 DIAGNOSIS — B009 Herpesviral infection, unspecified: Secondary | ICD-10-CM

## 2023-12-26 DIAGNOSIS — E876 Hypokalemia: Secondary | ICD-10-CM | POA: Diagnosis not present

## 2023-12-26 DIAGNOSIS — Z3490 Encounter for supervision of normal pregnancy, unspecified, unspecified trimester: Secondary | ICD-10-CM

## 2023-12-26 DIAGNOSIS — O10013 Pre-existing essential hypertension complicating pregnancy, third trimester: Secondary | ICD-10-CM | POA: Diagnosis not present

## 2023-12-26 DIAGNOSIS — Z3A39 39 weeks gestation of pregnancy: Secondary | ICD-10-CM | POA: Diagnosis not present

## 2023-12-26 DIAGNOSIS — Z720 Tobacco use: Secondary | ICD-10-CM

## 2023-12-26 DIAGNOSIS — O99891 Other specified diseases and conditions complicating pregnancy: Secondary | ICD-10-CM | POA: Diagnosis present

## 2023-12-26 DIAGNOSIS — O320XX0 Maternal care for unstable lie, not applicable or unspecified: Secondary | ICD-10-CM

## 2023-12-26 DIAGNOSIS — O98213 Gonorrhea complicating pregnancy, third trimester: Secondary | ICD-10-CM

## 2023-12-26 DIAGNOSIS — O409XX0 Polyhydramnios, unspecified trimester, not applicable or unspecified: Secondary | ICD-10-CM

## 2023-12-26 LAB — CBC
HCT: 27 % — ABNORMAL LOW (ref 36.0–46.0)
Hemoglobin: 8.6 g/dL — ABNORMAL LOW (ref 12.0–15.0)
MCH: 27.9 pg (ref 26.0–34.0)
MCHC: 31.9 g/dL (ref 30.0–36.0)
MCV: 87.7 fL (ref 80.0–100.0)
Platelets: 247 10*3/uL (ref 150–400)
RBC: 3.08 MIL/uL — ABNORMAL LOW (ref 3.87–5.11)
RDW: 14.2 % (ref 11.5–15.5)
WBC: 8.8 10*3/uL (ref 4.0–10.5)
nRBC: 0 % (ref 0.0–0.2)

## 2023-12-26 LAB — COMPREHENSIVE METABOLIC PANEL WITH GFR
ALT: 8 U/L (ref 0–44)
AST: 15 U/L (ref 15–41)
Albumin: 2.4 g/dL — ABNORMAL LOW (ref 3.5–5.0)
Alkaline Phosphatase: 120 U/L (ref 38–126)
Anion gap: 10 (ref 5–15)
BUN: 8 mg/dL (ref 6–20)
CO2: 18 mmol/L — ABNORMAL LOW (ref 22–32)
Calcium: 8.3 mg/dL — ABNORMAL LOW (ref 8.9–10.3)
Chloride: 107 mmol/L (ref 98–111)
Creatinine, Ser: 0.52 mg/dL (ref 0.44–1.00)
GFR, Estimated: >60 mL/min (ref >60)
Glucose, Bld: 75 mg/dL (ref 70–99)
Potassium: 3.2 mmol/L — ABNORMAL LOW (ref 3.5–5.1)
Sodium: 135 mmol/L (ref 135–145)
Total Bilirubin: 1.1 mg/dL (ref 0.0–1.2)
Total Protein: 5.7 g/dL — ABNORMAL LOW (ref 6.5–8.1)

## 2023-12-26 LAB — URINALYSIS, COMPLETE (UACMP) WITH MICROSCOPIC
Bilirubin Urine: NEGATIVE
Glucose, UA: NEGATIVE mg/dL
Hgb urine dipstick: NEGATIVE
Ketones, ur: 20 mg/dL — AB
Leukocytes,Ua: NEGATIVE
Nitrite: NEGATIVE
Protein, ur: 30 mg/dL — AB
Specific Gravity, Urine: 1.018 (ref 1.005–1.030)
pH: 6 (ref 5.0–8.0)

## 2023-12-26 LAB — MAGNESIUM: Magnesium: 1.5 mg/dL — ABNORMAL LOW (ref 1.7–2.4)

## 2023-12-26 MED ORDER — SODIUM CHLORIDE 0.9 % IV SOLN: INTRAVENOUS | Status: DC | PRN

## 2023-12-26 MED ORDER — SODIUM CHLORIDE 0.9 % IV BOLUS: 500.0000 mL | Freq: Once | INTRAVENOUS | Status: DC | PRN

## 2023-12-26 MED ORDER — LACTATED RINGERS IV BOLUS
1000.0000 mL | Freq: Once | INTRAVENOUS | Status: AC
  Administered 2023-12-26: 1000 mL via INTRAVENOUS

## 2023-12-26 MED ORDER — EPINEPHRINE 0.3 MG/0.3ML IJ SOAJ: 0.3000 mg | Freq: Once | INTRAMUSCULAR | Status: DC | PRN

## 2023-12-26 MED ORDER — CALCIUM CARBONATE ANTACID 500 MG PO CHEW: 2.0000 | CHEWABLE_TABLET | ORAL | Status: DC | PRN

## 2023-12-26 MED ORDER — MAGNESIUM SULFATE 2 GM/50ML IV SOLN
2.0000 g | Freq: Once | INTRAVENOUS | Status: AC
  Administered 2023-12-26: 2 g via INTRAVENOUS
  Filled 2023-12-26: qty 50

## 2023-12-26 MED ORDER — POTASSIUM CHLORIDE CRYS ER 20 MEQ PO TBCR: 20.0000 meq | EXTENDED_RELEASE_TABLET | Freq: Every day | ORAL | 0 refills | Status: DC

## 2023-12-26 MED ORDER — FERROUS SULFATE 325 (65 FE) MG PO TBEC: 325.0000 mg | DELAYED_RELEASE_TABLET | Freq: Every day | ORAL | 1 refills | Status: AC

## 2023-12-26 MED ORDER — IRON SUCROSE 300 MG IVPB - SIMPLE MED
300.0000 mg | Freq: Once | Status: AC
  Administered 2023-12-26: 300 mg via INTRAVENOUS
  Filled 2023-12-26: qty 300

## 2023-12-26 MED ORDER — DIPHENHYDRAMINE HCL 50 MG/ML IJ SOLN: 25.0000 mg | Freq: Once | INTRAMUSCULAR | Status: DC | PRN

## 2023-12-26 MED ORDER — ACETAMINOPHEN 500 MG PO TABS: 1000.0000 mg | ORAL_TABLET | Freq: Four times a day (QID) | ORAL | Status: DC | PRN

## 2023-12-26 MED ORDER — METHYLPREDNISOLONE SODIUM SUCC 125 MG IJ SOLR: 125.0000 mg | Freq: Once | INTRAMUSCULAR | Status: DC | PRN

## 2023-12-26 MED ORDER — ALBUTEROL SULFATE (2.5 MG/3ML) 0.083% IN NEBU: 2.5000 mg | INHALATION_SOLUTION | Freq: Once | RESPIRATORY_TRACT | Status: DC | PRN

## 2023-12-26 MED ORDER — POTASSIUM CHLORIDE CRYS ER 10 MEQ PO TBCR
40.0000 meq | EXTENDED_RELEASE_TABLET | Freq: Once | ORAL | Status: AC
  Administered 2023-12-26: 40 meq via ORAL
  Filled 2023-12-26: qty 4

## 2023-12-26 NOTE — OB Triage Note (Signed)
 Patient arrived in triage with complaints of back pain, shortness of breath since last night, and weakness in legs. Patient sitting up descscribing symptoms without difficulty. Ambulated to bathroom without difficulty, standby assist only. Patient reports good fetal movement. Denies leaking of fluid or vaginal bleeding.

## 2023-12-26 NOTE — Discharge Instructions (Signed)
 Induction of labor for [facility] at [facility]   Induction of labor scheduled for 12/28/2023 at 8:00 am.    You have been scheduled for induction of labor on 12/28/2023 at 8:00 am.  Please call Labor and Delivery at [phone] an hour before your induction time (12/28/23 at 7AM) to make sure that they have bed availability for you.  Please be patient during this time as we cannot control how many babies want to have a birthday at the same time.     LABOR: When contractions begin, you should start to time them from the beginning of one contraction to the beginning of the next.  When contractions are 5-10 minutes apart or less and have been regular for at least an hour, you should call your health care provider.  Notify your doctor if any of the following occur: 1. Bleeding from the vagina 7. Sudden, constant, or occasional abdominal pain  2. Pain or burning when urinating 8. Sudden gushing of fluid from the vagina (with or without continued leaking)  3. Chills or fever 9. Fainting spells, "black outs" or loss of consciousness  4. Increase in vaginal discharge 10. Severe or continued nausea or vomiting  5. Pelvic pressure (sudden increase) 11. Blurring of vision or spots before the eyes  6. Baby moving less than usual 12. Leaking of fluid    FETAL KICK COUNT: Lie on your left side for one hour after a meal, and count the number of times your baby kicks. If it is less than 5 times, get up, move around and drink some juice. Repeat the test 30 minutes later. If it is still less than 5 kicks in an hour, notify your doctor.

## 2023-12-26 NOTE — Progress Notes (Signed)
 [id number] at [redacted]w[redacted]d, Patient's last menstrual period was 03/27/2023 (exact date)., c/w  US  at [redacted]w[redacted]d per ACOG dating recommendations.  Scheduled for induction of labor for social factors, increased AFI, unstable like on 12/28/2023 at 0800.   Prenatal provider: [facility] Pregnancy complicated by: 1. Encounter for induction of labor   2. Hx of preeclampsia, prior pregnancy, currently pregnant   3. PCR DNA positive for HSV1   4. Chlamydia infection affecting pregnancy in third trimester   5. Gonorrhea affecting pregnancy in third trimester   6. Limited prenatal care in third trimester   7. Iron deficiency anemia during pregnancy, antepartum   8. Marijuana use during pregnancy   9. Tobacco use   10. Unstable fetal lie, single or unspecified fetus   11. AFI (amniotic fluid index) increased      Prenatal Labs: Blood type/Rh AB POS  Antibody screen neg  Rubella Non-immune    Varicella Immune  RPR NR  HBsAg Neg  Hep C NR  HIV Neg  GC POS - need TOC on admission   Chlamydia POS - need TOC on admission  Genetic screening cfDNA negative  1 hour GTT 101  3 hour GTT N/A  GBS Pos   Flu: declined  Tdap: declined  RSV: not in season  Contraception: TBD Feeding preference: formula feeding  ____ Fraser Jackson, CNM Certified Nurse Midwife [place]  [facility]

## 2023-12-26 NOTE — Discharge Summary (Signed)
 Audrey Beard is a 28 y.o. female. She is at [redacted]w[redacted]d gestation. Patient's last menstrual period was 03/27/2023 (exact date). 01/01/2024, by Last Menstrual Period   Prenatal care site: [facility]  Chief complaint: back pain   Admission Diagnoses:  1) intrauterine pregnancy at [redacted]w[redacted]d  2) Back pain affecting pregnancy [O99.891, M54.9]  Discharge Diagnoses:  Active Problems:   Hypokalemia   Back pain affecting pregnancy   Anemia affecting pregnancy in third trimester   Hypomagnesemia  HPI: Audrey Beard presents to L&D with complaints of lower back pain that radiates to her thighs. Pain makes it feel like her legs are weak when she's ambulating. She also reports shortness of breath that started last night. It improves when she sits down and gets worse if she's walking too much. Denies chest pain or palpitations. Her pregnancy is complicated by late PNC, increased AFI, unstable lie, anemia in pregnancy, chlamydia and gonorrhea infection in pregnancy, HSV hx, marijuana use in pregnancy, tobacco use, history of preeclampsia.  She denies Loss of fluid or Vaginal bleeding. She's unsure about contractions but states her back pain comes and goes. Endorses fetal movement as active.   S: Resting comfortably. Irregular mild contractions, no VB.no LOF,  Active fetal movement.   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    Past Surgical Hx:  has a past surgical history that includes No past surgeries.   No Known Allergies   Prior to Admission medications   Medication Sig Start Date End Date Taking? Authorizing Provider  ferrous sulfate  325 (65 FE) MG EC tablet Take 1 tablet (325 mg total) by mouth daily with breakfast. 12/26/23 02/24/24 Yes Teodora Fell, CNM  potassium chloride  SA (KLOR-CON  M) 20 MEQ tablet Take 1 tablet (20 mEq total) by mouth daily. 12/26/23  Yes Teodora Fell, CNM  ondansetron  (ZOFRAN -ODT) 4 MG disintegrating tablet Take 1 tablet (4 mg total) by mouth every 8 (eight) hours as needed for nausea or vomiting. Patient not taking: Reported on 12/26/2023 10/27/23   Teodora Fell, CNM    Social History: She  reports that she has quit smoking. Her smoking use included cigarettes. She has a 1 pack-year smoking history. She has never used smokeless tobacco. She reports that she does not drink alcohol and does not use drugs.  Family History: family history is not on file.   Review of Systems: A full review of systems was performed and negative except as noted in the HPI.     Pertinent Results:   O:  BP 115/68   Pulse 87   Temp 98.1 F (36.7 C) (Oral)   Resp 16   Ht 5\' 3"  (1.6 m)   Wt 84.8 kg   LMP 03/27/2023 (Exact Date)   SpO2 99%   BMI 33.13 kg/m  Results for orders placed or performed during the hospital encounter of 12/26/23 (from the past 48 hours)  Urinalysis, Complete w Microscopic -Urine, Clean Catch   Collection Time: 12/26/23  1:51 PM  Result Value Ref Range   Color, Urine AMBER (A) YELLOW   APPearance CLOUDY (A) CLEAR   Specific Gravity, Urine 1.018 1.005 - 1.030   pH 6.0 5.0 - 8.0   Glucose, UA NEGATIVE NEGATIVE mg/dL   Hgb urine dipstick NEGATIVE NEGATIVE   Bilirubin Urine NEGATIVE NEGATIVE  Ketones, ur 20 (A) NEGATIVE mg/dL   Protein, ur 30 (A) NEGATIVE mg/dL   Nitrite NEGATIVE NEGATIVE   Leukocytes,Ua NEGATIVE NEGATIVE   RBC / HPF 0-5 0 - 5 RBC/hpf   WBC, UA 11-20 0 - 5 WBC/hpf   Bacteria, UA MANY (A) NONE SEEN   Squamous Epithelial / HPF 21-50 0 - 5 /HPF   Mucus PRESENT   Comprehensive metabolic panel   Collection Time: 12/26/23  3:09 PM  Result Value Ref Range   Sodium 135 135 - 145 mmol/L   Potassium 3.2 (L) 3.5 - 5.1 mmol/L   Chloride 107 98 - 111 mmol/L   CO2 18 (L) 22 - 32 mmol/L    Glucose, Bld 75 70 - 99 mg/dL   BUN 8 6 - 20 mg/dL   Creatinine, Ser 1.61 0.44 - 1.00 mg/dL   Calcium  8.3 (L) 8.9 - 10.3 mg/dL   Total Protein 5.7 (L) 6.5 - 8.1 g/dL   Albumin 2.4 (L) 3.5 - 5.0 g/dL   AST 15 15 - 41 U/L   ALT 8 0 - 44 U/L   Alkaline Phosphatase 120 38 - 126 U/L   Total Bilirubin 1.1 0.0 - 1.2 mg/dL   GFR, Estimated >09 >60 mL/min   Anion gap 10 5 - 15  CBC   Collection Time: 12/26/23  3:09 PM  Result Value Ref Range   WBC 8.8 4.0 - 10.5 K/uL   RBC 3.08 (L) 3.87 - 5.11 MIL/uL   Hemoglobin 8.6 (L) 12.0 - 15.0 g/dL   HCT 45.4 (L) 09.8 - 11.9 %   MCV 87.7 80.0 - 100.0 fL   MCH 27.9 26.0 - 34.0 pg   MCHC 31.9 30.0 - 36.0 g/dL   RDW 14.7 82.9 - 56.2 %   Platelets 247 150 - 400 K/uL   nRBC 0.0 0.0 - 0.2 %  Magnesium    Collection Time: 12/26/23  3:09 PM  Result Value Ref Range   Magnesium  1.5 (L) 1.7 - 2.4 mg/dL    No results found.  Constitutional: NAD, AAOx3  PULM: nl respiratory effort Abd: gravid, non-tender Ext: Non-tender, Nonedmeatous Psych: mood appropriate, speech normal SVE: Dilation: Closed (outer os 1cm) Exam by:: Audrey Beard CNM   NST: Baseline FHR: 140 beats/min Variability: moderate Accelerations: present Decelerations: absent Tocometry: Irregular, mild contractions  Time: at least 20 minutes   Interpretation: Category I INDICATIONS: rule out uterine contractions RESULTS:  A NST procedure was performed with FHR monitoring and a normal baseline established, appropriate time of 20-40 minutes of evaluation, and accels >2 seen w 15x15 characteristics.  Results show a REACTIVE NST.   Consults: None  Procedures: NST, IV fluids   Hospital Course: The patient was admitted to Labor and Delivery Triage for observation. She was given IV fluids for dehydration. Discussed electrolyte imbalance and corrected with IV magnesium  and PO potassium. IV venofer transfusion was given for iron deficiency anemia in pregnancy. We also discussed the risks  associated with anemia in pregnancy and potential for blood transfusion. Discussed risk/benefits of IOL by due date given significant social factors, increased AFI (24 cm), and unstable lie. Presentation is cephalic today and was documented as cephalic on  12/20/2023 by US . Lane Pinon desires an induction and scheduled for 12/28/2023 at 0800. We reviewed warning s/s to return to L&D for. Rx for Kdur and iron supplements sent to pharmacy.  She was deemed stable for discharge and further outpatient management.   Discharge Condition: stable  Disposition:  Discharge disposition: 01-Home  or Self Care       Allergies as of 12/26/2023   No Known Allergies      Medication List     TAKE these medications    ferrous sulfate  325 (65 FE) MG EC tablet Take 1 tablet (325 mg total) by mouth daily with breakfast.   ondansetron  4 MG disintegrating tablet Commonly known as: ZOFRAN -ODT Take 1 tablet (4 mg total) by mouth every 8 (eight) hours as needed for nausea or vomiting.   potassium chloride  SA 20 MEQ tablet Commonly known as: KLOR-CON  M Take 1 tablet (20 mEq total) by mouth daily.        Follow-up Information     [facility]. Call in 2 day(s).   Specialty: Obstetrics and Gynecology Why: for scheduled induction of labor at 8:00. Call [phone] to check bed availability 1 hour prior to induction time. Contact information: [address] [place]  [address] [phone]               ----- Teodora Fell, CNM  Certified Nurse Midwife [facility]

## 2023-12-28 ENCOUNTER — Inpatient Hospital Stay
Admission: RE | Admit: 2023-12-28 | Discharge: 2023-12-30 | DRG: 806 | Disposition: A | Discharge status: Home / Self Care | Attending: Certified Nurse Midwife | Admitting: Certified Nurse Midwife

## 2023-12-28 ENCOUNTER — Inpatient Hospital Stay: Admitting: Anesthesiology

## 2023-12-28 ENCOUNTER — Other Ambulatory Visit: Payer: Self-pay

## 2023-12-28 ENCOUNTER — Encounter: Payer: Self-pay | Admitting: Obstetrics and Gynecology

## 2023-12-28 DIAGNOSIS — O98219 Gonorrhea complicating pregnancy, unspecified trimester: Secondary | ICD-10-CM | POA: Diagnosis present

## 2023-12-28 DIAGNOSIS — O99013 Anemia complicating pregnancy, third trimester: Secondary | ICD-10-CM | POA: Diagnosis present

## 2023-12-28 DIAGNOSIS — O320XX0 Maternal care for unstable lie, not applicable or unspecified: Principal | ICD-10-CM | POA: Diagnosis present

## 2023-12-28 DIAGNOSIS — Z720 Tobacco use: Secondary | ICD-10-CM | POA: Diagnosis present

## 2023-12-28 DIAGNOSIS — O0933 Supervision of pregnancy with insufficient antenatal care, third trimester: Secondary | ICD-10-CM

## 2023-12-28 DIAGNOSIS — O9832 Other infections with a predominantly sexual mode of transmission complicating childbirth: Secondary | ICD-10-CM | POA: Diagnosis present

## 2023-12-28 DIAGNOSIS — D62 Acute posthemorrhagic anemia: Secondary | ICD-10-CM | POA: Diagnosis not present

## 2023-12-28 DIAGNOSIS — Z87891 Personal history of nicotine dependence: Secondary | ICD-10-CM | POA: Diagnosis not present

## 2023-12-28 DIAGNOSIS — O9081 Anemia of the puerperium: Secondary | ICD-10-CM | POA: Diagnosis not present

## 2023-12-28 DIAGNOSIS — O99214 Obesity complicating childbirth: Secondary | ICD-10-CM | POA: Diagnosis present

## 2023-12-28 DIAGNOSIS — F1290 Cannabis use, unspecified, uncomplicated: Secondary | ICD-10-CM | POA: Diagnosis present

## 2023-12-28 DIAGNOSIS — B009 Herpesviral infection, unspecified: Secondary | ICD-10-CM

## 2023-12-28 DIAGNOSIS — D509 Iron deficiency anemia, unspecified: Secondary | ICD-10-CM | POA: Diagnosis present

## 2023-12-28 DIAGNOSIS — B9689 Other specified bacterial agents as the cause of diseases classified elsewhere: Secondary | ICD-10-CM

## 2023-12-28 DIAGNOSIS — A6000 Herpesviral infection of urogenital system, unspecified: Secondary | ICD-10-CM | POA: Diagnosis present

## 2023-12-28 DIAGNOSIS — F418 Other specified anxiety disorders: Secondary | ICD-10-CM | POA: Diagnosis present

## 2023-12-28 DIAGNOSIS — O409XX0 Polyhydramnios, unspecified trimester, not applicable or unspecified: Secondary | ICD-10-CM | POA: Diagnosis present

## 2023-12-28 DIAGNOSIS — Z3A39 39 weeks gestation of pregnancy: Secondary | ICD-10-CM | POA: Diagnosis not present

## 2023-12-28 DIAGNOSIS — O99824 Streptococcus B carrier state complicating childbirth: Secondary | ICD-10-CM | POA: Diagnosis present

## 2023-12-28 DIAGNOSIS — Z3490 Encounter for supervision of normal pregnancy, unspecified, unspecified trimester: Secondary | ICD-10-CM | POA: Diagnosis present

## 2023-12-28 DIAGNOSIS — O09299 Supervision of pregnancy with other poor reproductive or obstetric history, unspecified trimester: Principal | ICD-10-CM

## 2023-12-28 DIAGNOSIS — R87612 Low grade squamous intraepithelial lesion on cytologic smear of cervix (LGSIL): Secondary | ICD-10-CM | POA: Diagnosis present

## 2023-12-28 DIAGNOSIS — O99320 Drug use complicating pregnancy, unspecified trimester: Secondary | ICD-10-CM | POA: Diagnosis present

## 2023-12-28 DIAGNOSIS — A749 Chlamydial infection, unspecified: Secondary | ICD-10-CM | POA: Diagnosis present

## 2023-12-28 DIAGNOSIS — O98213 Gonorrhea complicating pregnancy, third trimester: Secondary | ICD-10-CM

## 2023-12-28 LAB — URINE DRUG SCREEN, QUALITATIVE (ARMC ONLY)
Amphetamines, Ur Screen: NOT DETECTED
Barbiturates, Ur Screen: NOT DETECTED
Benzodiazepine, Ur Scrn: NOT DETECTED
Cannabinoid 50 Ng, Ur ~~LOC~~: NOT DETECTED
Cocaine Metabolite,Ur ~~LOC~~: NOT DETECTED
MDMA (Ecstasy)Ur Screen: NOT DETECTED
Methadone Scn, Ur: NOT DETECTED
Opiate, Ur Screen: NOT DETECTED
Phencyclidine (PCP) Ur S: NOT DETECTED
Tricyclic, Ur Screen: NOT DETECTED

## 2023-12-28 LAB — CHLAMYDIA/NGC RT PCR (ARMC ONLY)
Chlamydia Tr: NOT DETECTED
N gonorrhoeae: NOT DETECTED

## 2023-12-28 LAB — CBC
HCT: 28.5 % — ABNORMAL LOW (ref 36.0–46.0)
Hemoglobin: 9.2 g/dL — ABNORMAL LOW (ref 12.0–15.0)
MCH: 27.9 pg (ref 26.0–34.0)
MCHC: 32.3 g/dL (ref 30.0–36.0)
MCV: 86.4 fL (ref 80.0–100.0)
Platelets: 267 10*3/uL (ref 150–400)
RBC: 3.3 MIL/uL — ABNORMAL LOW (ref 3.87–5.11)
RDW: 14.4 % (ref 11.5–15.5)
WBC: 12.2 10*3/uL — ABNORMAL HIGH (ref 4.0–10.5)
nRBC: 0.3 % — ABNORMAL HIGH (ref 0.0–0.2)

## 2023-12-28 LAB — OB RESULTS CONSOLE VARICELLA ZOSTER ANTIBODY, IGG: Varicella: IMMUNE

## 2023-12-28 LAB — URINE CULTURE

## 2023-12-28 LAB — OB RESULTS CONSOLE HEPATITIS B SURFACE ANTIGEN: Hepatitis B Surface Ag: NEGATIVE

## 2023-12-28 LAB — TYPE AND SCREEN
ABO/RH(D): AB POS
Antibody Screen: NEGATIVE

## 2023-12-28 LAB — OB RESULTS CONSOLE RUBELLA ANTIBODY, IGM: Rubella: NON-IMMUNE/NOT IMMUNE

## 2023-12-28 MED ORDER — IRON SUCROSE 300 MG IVPB - SIMPLE MED
300.0000 mg | Freq: Once | Status: AC
  Administered 2023-12-28: 300 mg via INTRAVENOUS
  Filled 2023-12-28: qty 300

## 2023-12-28 MED ORDER — MISOPROSTOL 25 MCG QUARTER TABLET: 25.0000 ug | ORAL_TABLET | Freq: Once | ORAL | Status: DC

## 2023-12-28 MED ORDER — FENTANYL CITRATE (PF) 100 MCG/2ML IJ SOLN
50.0000 ug | INTRAMUSCULAR | Status: DC | PRN
Start: 2023-12-28 — End: 2023-12-28
  Administered 2023-12-28: 100 ug via INTRAVENOUS
  Filled 2023-12-28: qty 2

## 2023-12-28 MED ORDER — MISOPROSTOL 200 MCG PO TABS
ORAL_TABLET | ORAL | Status: AC
  Filled 2023-12-28: qty 4

## 2023-12-28 MED ORDER — TERBUTALINE SULFATE 1 MG/ML IJ SOLN: 0.2500 mg | Freq: Once | INTRAMUSCULAR | Status: DC | PRN

## 2023-12-28 MED ORDER — PHENYLEPHRINE 80 MCG/ML (10ML) SYRINGE FOR IV PUSH (FOR BLOOD PRESSURE SUPPORT)
80.0000 ug | PREFILLED_SYRINGE | INTRAVENOUS | Status: DC | PRN
  Administered 2023-12-28 (×2): 160 ug via INTRAVENOUS

## 2023-12-28 MED ORDER — WITCH HAZEL-GLYCERIN EX PADS: 1.0000 | MEDICATED_PAD | CUTANEOUS | Status: DC | PRN

## 2023-12-28 MED ORDER — COCONUT OIL OIL: 1.0000 | TOPICAL_OIL | Status: DC | PRN

## 2023-12-28 MED ORDER — DIPHENHYDRAMINE HCL 50 MG/ML IJ SOLN: 12.5000 mg | INTRAMUSCULAR | Status: DC | PRN

## 2023-12-28 MED ORDER — OXYTOCIN-SODIUM CHLORIDE 30-0.9 UT/500ML-% IV SOLN
1.0000 m[IU]/min | INTRAVENOUS | Status: DC
  Administered 2023-12-28: 2 m[IU]/min via INTRAVENOUS
  Filled 2023-12-28: qty 500

## 2023-12-28 MED ORDER — DIBUCAINE (PERIANAL) 1 % EX OINT: 1.0000 | TOPICAL_OINTMENT | CUTANEOUS | Status: DC | PRN

## 2023-12-28 MED ORDER — SODIUM CHLORIDE 0.9% FLUSH: 3.0000 mL | INTRAVENOUS | Status: DC | PRN

## 2023-12-28 MED ORDER — OXYTOCIN-SODIUM CHLORIDE 30-0.9 UT/500ML-% IV SOLN
2.5000 [IU]/h | INTRAVENOUS | Status: DC
  Administered 2023-12-28: 2.5 [IU]/h via INTRAVENOUS

## 2023-12-28 MED ORDER — AMMONIA AROMATIC IN INHA
RESPIRATORY_TRACT | Status: AC
  Filled 2023-12-28: qty 10

## 2023-12-28 MED ORDER — PHENYLEPHRINE 80 MCG/ML (10ML) SYRINGE FOR IV PUSH (FOR BLOOD PRESSURE SUPPORT)
80.0000 ug | PREFILLED_SYRINGE | INTRAVENOUS | Status: DC | PRN
  Filled 2023-12-28: qty 10

## 2023-12-28 MED ORDER — ONDANSETRON HCL 4 MG/2ML IJ SOLN: 4.0000 mg | Freq: Four times a day (QID) | INTRAMUSCULAR | Status: DC | PRN

## 2023-12-28 MED ORDER — EPHEDRINE 5 MG/ML INJ
10.0000 mg | INTRAVENOUS | Status: AC | PRN
  Administered 2023-12-28 (×2): 10 mg via INTRAVENOUS
  Filled 2023-12-28: qty 5

## 2023-12-28 MED ORDER — SODIUM CHLORIDE 0.9 % IV SOLN
5.0000 10*6.[IU] | Freq: Once | INTRAVENOUS | Status: AC
  Administered 2023-12-28: 5 10*6.[IU] via INTRAVENOUS
  Filled 2023-12-28: qty 5

## 2023-12-28 MED ORDER — LACTATED RINGERS IV SOLN: INTRAVENOUS | Status: DC

## 2023-12-28 MED ORDER — PRENATAL MULTIVITAMIN CH
1.0000 | ORAL_TABLET | Freq: Every day | ORAL | Status: DC
  Administered 2023-12-29 – 2023-12-30 (×2): 1 via ORAL
  Filled 2023-12-28 (×2): qty 1

## 2023-12-28 MED ORDER — ONDANSETRON HCL 4 MG PO TABS: 4.0000 mg | ORAL_TABLET | ORAL | Status: DC | PRN

## 2023-12-28 MED ORDER — DIPHENHYDRAMINE HCL 25 MG PO CAPS: 25.0000 mg | ORAL_CAPSULE | Freq: Four times a day (QID) | ORAL | Status: DC | PRN

## 2023-12-28 MED ORDER — LIDOCAINE-EPINEPHRINE (PF) 1.5 %-1:200000 IJ SOLN
INTRAMUSCULAR | Status: DC | PRN
  Administered 2023-12-28: 3 mL via EPIDURAL

## 2023-12-28 MED ORDER — ACETAMINOPHEN 325 MG PO TABS: 650.0000 mg | ORAL_TABLET | ORAL | Status: DC | PRN

## 2023-12-28 MED ORDER — ACETAMINOPHEN 500 MG PO TABS: 1000.0000 mg | ORAL_TABLET | Freq: Four times a day (QID) | ORAL | Status: DC | PRN

## 2023-12-28 MED ORDER — ZOLPIDEM TARTRATE 5 MG PO TABS: 5.0000 mg | ORAL_TABLET | Freq: Every evening | ORAL | Status: DC | PRN

## 2023-12-28 MED ORDER — SOD CITRATE-CITRIC ACID 500-334 MG/5ML PO SOLN: 30.0000 mL | ORAL | Status: DC | PRN

## 2023-12-28 MED ORDER — LIDOCAINE HCL (PF) 1 % IJ SOLN
30.0000 mL | INTRAMUSCULAR | Status: DC | PRN
  Filled 2023-12-28: qty 30

## 2023-12-28 MED ORDER — SIMETHICONE 80 MG PO CHEW: 80.0000 mg | CHEWABLE_TABLET | ORAL | Status: DC | PRN

## 2023-12-28 MED ORDER — MEASLES, MUMPS & RUBELLA VAC IJ SOLR
0.5000 mL | Freq: Once | INTRAMUSCULAR | Status: DC
  Filled 2023-12-28: qty 0.5

## 2023-12-28 MED ORDER — OXYTOCIN BOLUS FROM INFUSION
333.0000 mL | Freq: Once | INTRAVENOUS | Status: AC
  Administered 2023-12-28: 333 mL via INTRAVENOUS

## 2023-12-28 MED ORDER — FENTANYL-BUPIVACAINE-NACL 0.5-0.125-0.9 MG/250ML-% EP SOLN
12.0000 mL/h | EPIDURAL | Status: DC | PRN
  Administered 2023-12-28: 12 mL/h via EPIDURAL
  Filled 2023-12-28: qty 250

## 2023-12-28 MED ORDER — SENNOSIDES-DOCUSATE SODIUM 8.6-50 MG PO TABS
2.0000 | ORAL_TABLET | Freq: Every day | ORAL | Status: DC
  Administered 2023-12-30: 2 via ORAL
  Filled 2023-12-28 (×2): qty 2

## 2023-12-28 MED ORDER — ONDANSETRON HCL 4 MG/2ML IJ SOLN: 4.0000 mg | INTRAMUSCULAR | Status: DC | PRN

## 2023-12-28 MED ORDER — BUPIVACAINE HCL (PF) 0.25 % IJ SOLN
INTRAMUSCULAR | Status: DC | PRN
  Administered 2023-12-28: 8 mL via EPIDURAL

## 2023-12-28 MED ORDER — PENICILLIN G POT IN DEXTROSE 60000 UNIT/ML IV SOLN
3.0000 10*6.[IU] | INTRAVENOUS | Status: DC
  Administered 2023-12-28 (×2): 3 10*6.[IU] via INTRAVENOUS
  Filled 2023-12-28 (×2): qty 50

## 2023-12-28 MED ORDER — OXYTOCIN 10 UNIT/ML IJ SOLN
INTRAMUSCULAR | Status: AC
  Filled 2023-12-28: qty 2

## 2023-12-28 MED ORDER — LACTATED RINGERS IV SOLN
500.0000 mL | Freq: Once | INTRAVENOUS | Status: AC
  Administered 2023-12-28: 500 mL via INTRAVENOUS

## 2023-12-28 MED ORDER — BENZOCAINE-MENTHOL 20-0.5 % EX AERO: 1.0000 | INHALATION_SPRAY | CUTANEOUS | Status: DC | PRN

## 2023-12-28 MED ORDER — EPHEDRINE 5 MG/ML INJ: 10.0000 mg | INTRAVENOUS | Status: DC | PRN

## 2023-12-28 MED ORDER — IBUPROFEN 600 MG PO TABS
600.0000 mg | ORAL_TABLET | Freq: Four times a day (QID) | ORAL | Status: DC
  Administered 2023-12-29 – 2023-12-30 (×6): 600 mg via ORAL
  Filled 2023-12-28 (×6): qty 1

## 2023-12-28 MED ORDER — SODIUM CHLORIDE 0.9 % IV SOLN: 250.0000 mL | INTRAVENOUS | Status: DC | PRN

## 2023-12-28 MED ORDER — SODIUM CHLORIDE 0.9% FLUSH: 3.0000 mL | Freq: Two times a day (BID) | INTRAVENOUS | Status: DC

## 2023-12-28 MED ORDER — LACTATED RINGERS IV SOLN: 500.0000 mL | INTRAVENOUS | Status: DC | PRN

## 2023-12-28 NOTE — Discharge Summary (Shared)
 Postpartum Discharge Summary  Patient Name: Audrey Beard DOB: Jul 19, 1996 MRN: [id number]  Date of admission: 12/28/2023 Delivery date:12/28/2023 Delivering provider: Lavanda Porter Beard Date of discharge: 12/30/2023  Primary OB: [facility] KGM:[id number] last menstrual period was 03/27/2023 (exact date). EDC Estimated Date of Delivery: 01/01/24 Gestational Age at Delivery: [redacted]w[redacted]d   Admitting diagnosis: Encounter for induction of labor [Z34.90] Intrauterine pregnancy: [redacted]w[redacted]d     Secondary diagnosis:   Principal Problem:   NSVD (normal spontaneous vaginal delivery) Active Problems:   Hx of preeclampsia, prior pregnancy, currently pregnant   Other specified anxiety disorders   Papanicolaou smear of cervix with low grade squamous intraepithelial lesion (LGSIL)   PCR DNA positive for HSV1   Chlamydia infection affecting pregnancy   Gonorrhea affecting pregnancy   Limited prenatal care in third trimester   Iron deficiency anemia during pregnancy, antepartum   Marijuana use during pregnancy   Anemia affecting pregnancy in third trimester   Mixed anxiety depressive disorder   Tobacco use   Unstable lie of fetus   AFI (amniotic fluid index) increased   Encounter for induction of labor   Shoulder dystocia during labor and delivery  Discharge Diagnosis: Term Pregnancy Delivered                                                Post partum procedures:Venofer Induction:: AROM and Pitocin  Complications: None Delivery Type: spontaneous vaginal delivery, shoulder dystocia Anesthesia: epidural anesthesia Placenta: spontaneous To Pathology: No  Laceration: none Episiotomy: none  Prenatal Labs:  Blood type/Rh AB POS  Antibody screen neg  Rubella Non-immune    Varicella Immune  RPR NR  HBsAg Neg  Hep C NR  HIV Neg  GC POS @ 36wks, TOC pending  Chlamydia POS @ 36wks, TOC pending  Genetic screening cfDNA negative  1 hour GTT 101  3 hour GTT N/A  GBS Pos    Hospital  course: Induction of Labor With Vaginal Delivery   28 y.o. yo [id number] at [redacted]w[redacted]d was admitted to the hospital 12/28/2023 for induction of labor.  Indication for induction: Unstable lie and increased AFI.  Patient had an labor course without complication. She progressed to 10/100/+2 and pushed , delivering viable female infant with 45 second shoulder dystocia relieved with Rubin II maneuver, over intact perineum. Apgars 8/9. Membrane Rupture Time/Date: 2:07 PM,12/28/2023  Delivery Method:Vaginal, Spontaneous Operative Delivery:N/A Episiotomy: None Lacerations:  None Details of delivery can be found in separate delivery note.  Patient had a postpartum course complicated by severe anemia. Patient is discharged home 12/30/23.  Newborn Data: "Audrey Beard" Birth date:12/28/2023 Birth time:8:11 PM Gender:Female Living status:Living Apgars:8 ,9  Weight:2940 g  Magnesium  Sulfate received: No BMZ received: No Rhophylac:No MMR:No  Declined Varivax vaccine given: was not indicated T-DaP:declined Flu: N/A  Transfusion:No  Physical exam  Vitals:   12/29/23 0810 12/29/23 1633 12/29/23 2327 12/30/23 0745  BP: 113/70 115/64 124/67 117/89  Pulse: 86 77 70 77  Resp: 18 16 20 18   Temp: 98.3 F (36.8 C) 98.3 F (36.8 C) 98.6 F (37 C) 97.8 F (36.6 C)  TempSrc: Oral Oral Oral Oral  SpO2: 97% 98% 98% 97%  Weight:      Height:       General: alert, cooperative, and no distress Lochia: appropriate Uterine Fundus: firm Perineum:minimal edema/intact Incision: n/a DVT Evaluation: No evidence of DVT seen  on physical exam.  Labs: Lab Results  Component Value Date   WBC 13.3 (H) 12/29/2023   HGB 8.4 (L) 12/29/2023   HCT 26.2 (L) 12/29/2023   MCV 87.6 12/29/2023   PLT 227 12/29/2023      Latest Ref Rng & Units 12/26/2023    3:09 PM  CMP  Glucose 70 - 99 mg/dL 75   BUN 6 - 20 mg/dL 8   Creatinine 1.61 - 0.96 mg/dL 0.45   Sodium 409 - 811 mmol/L 135   Potassium 3.5 - 5.1 mmol/L 3.2    Chloride 98 - 111 mmol/L 107   CO2 22 - 32 mmol/L 18   Calcium  8.9 - 10.3 mg/dL 8.3   Total Protein 6.5 - 8.1 g/dL 5.7   Total Bilirubin 0.0 - 1.2 mg/dL 1.1   Alkaline Phos 38 - 126 U/L 120   AST 15 - 41 U/L 15   ALT 0 - 44 U/L 8    Edinburgh Score:    12/28/2023   11:15 PM  Edinburgh Postnatal Depression Scale Screening Tool  I have been able to laugh and see the funny side of things. 0  I have looked forward with enjoyment to things. 0  I have blamed myself unnecessarily when things went wrong. 0  I have been anxious or worried for no good reason. 0  I have felt scared or panicky for no good reason. 0  Things have been getting on top of me. 0  I have been so unhappy that I have had difficulty sleeping. 0  I have felt sad or miserable. 0  I have been so unhappy that I have been crying. 0  The thought of harming myself has occurred to me. 0  Edinburgh Postnatal Depression Scale Total 0    Risk assessment for postpartum VTE and prophylactic treatment: Very high risk factors: None High risk factors: None Moderate risk factors: BMI 30-40 kg/m2  Postpartum VTE prophylaxis with LMWH not indicated  After visit meds:  Allergies as of 12/30/2023   No Known Allergies      Medication List     STOP taking these medications    ondansetron  4 MG disintegrating tablet Commonly known as: ZOFRAN -ODT   potassium chloride  SA 20 MEQ tablet Commonly known as: KLOR-CON  M       TAKE these medications    ferrous sulfate  325 (65 FE) MG EC tablet Take 1 tablet (325 mg total) by mouth daily with breakfast.       Discharge home in stable condition Infant Feeding: Bottle Infant Disposition:home with mother Discharge instruction: per After Visit Summary and Postpartum booklet. Activity: Advance as tolerated. Pelvic rest for 6 weeks.  Diet: routine diet Anticipated Birth Control: OCPs Postpartum Appointment:6 weeks Additional Postpartum F/U: Postpartum Depression  checkup Future Appointments:No future appointments. Follow up Visit:  Follow-up Information     Audrey Beard, CNM. Schedule an appointment as soon as possible for a visit in 1 week(s).   Specialty: Certified Nurse Midwife Why: for a mood check Contact information: [address] [place] [address] [phone]         Audrey Beard, CNM. Schedule an appointment as soon as possible for a visit in 6 week(s).   Specialty: Certified Nurse Midwife Contact information: [address] [place] [address] [phone]                 Plan:  TAREVA LESKE was discharged to home in good condition. Follow-up  appointment as directed.    Signed:  Aron Lard, CNM 12/30/2023 9:14 AM

## 2023-12-28 NOTE — H&P (Signed)
 OB History & Physical   History of Present Illness:  Chief Complaint:   HPI:  Audrey Beard is a 28 y.o. G79P5005 female at [redacted]w[redacted]d dated by LMP.  She presents to L&D for scheduled IOL for unable lie of the fetus and increased AFI and social factors.     Pregnancy Issues: 1. Encounter for induction of labor     2. Hx of preeclampsia, prior pregnancy, currently pregnant   3. PCR DNA positive for HSV1   4. Chlamydia infection affecting pregnancy in third trimester   5. Gonorrhea affecting pregnancy in third trimester   6. Limited prenatal care in third trimester   7. Iron deficiency anemia during pregnancy, antepartum   8. Marijuana use during pregnancy   9. Tobacco use   10. Unstable fetal lie, single or unspecified fetus   11. AFI (amniotic fluid index) increased      Maternal Medical History:   Past Medical History:  Diagnosis Date   AKI (acute kidney injury) (HCC) 12/13/2021   Anemia    Depression    Moderate recurrent major depression (HCC) 09/24/2017   Pregnancy induced hypertension    Sepsis (HCC) 12/12/2021   Tobacco use disorder 05/17/2018    Past Surgical History:  Procedure Laterality Date   NO PAST SURGERIES      No Known Allergies  Prior to Admission medications   Medication Sig Start Date End Date Taking? Authorizing Provider  ferrous sulfate  325 (65 FE) MG EC tablet Take 1 tablet (325 mg total) by mouth daily with breakfast. Patient not taking: Reported on 12/28/2023 12/26/23 02/24/24  Teodora Fell, CNM  ondansetron  (ZOFRAN -ODT) 4 MG disintegrating tablet Take 1 tablet (4 mg total) by mouth every 8 (eight) hours as needed for nausea or vomiting. Patient not taking: Reported on 12/26/2023 10/27/23   Teodora Fell, CNM  potassium chloride  SA (KLOR-CON  M) 20 MEQ tablet Take 1 tablet (20 mEq total) by mouth daily. Patient not taking: Reported on 12/28/2023 12/26/23   Teodora Fell, CNM    Prenatal care site: [facility]   Social History: She   reports that she has quit smoking. Her smoking use included cigarettes. She has a 1 pack-year smoking history. She has never used smokeless tobacco. She reports that she does not drink alcohol and does not use drugs.  Family History: family history is not on file.   Review of Systems: A full review of systems was performed and negative except as noted in the HPI.     Physical Exam:  Vital Signs: BP 131/84   Pulse (!) 103   Temp 98 F (36.7 C) (Oral)   Resp 18   Ht 5\' 3"  (1.6 m)   Wt 84.8 kg   LMP 03/27/2023 (Exact Date)   BMI 33.13 kg/m  General: no acute distress.  HEENT: normocephalic, atraumatic Heart: regular rate & rhythm.  No murmurs/rubs/gallops Lungs: clear to auscultation bilaterally, normal respiratory effort Abdomen: soft, gravid, non-tender;  EFW: 7lb Pelvic:   External: Normal external female genitalia  Cervix: Dilation: 2 / Effacement (%): 50 / Station: -2    Extremities: non-tender, symmetric, no edema bilaterally.  DTRs: +2  Neurologic: Alert & oriented x 3.    No results found for this or any previous visit (from the past 24 hours).  Pertinent Results:  Prenatal Labs: Blood type/Rh AB POS  Antibody screen neg  Rubella Non-immune    Varicella Immune  RPR NR  HBsAg Neg  Hep C NR  HIV Neg  GC POS @ 36wks, TOC pending  Chlamydia POS @ 36wks, TOC pending  Genetic screening cfDNA negative  1 hour GTT 101  3 hour GTT N/A  GBS Pos   FHT: 145bpm, moderate variability, accelerations present, Category I tracing TOCO: contractions q2-69min SVE:  Dilation: 2 / Effacement (%): 50 / Station: -2    Cephalic by leopolds  No results found.  Assessment:  Audrey Beard is a 28 y.o. G88P5005 female at [redacted]w[redacted]d with IOL for unstable lie.   Plan:  1. Admit to Labor & Delivery; consents reviewed and obtained - Dr. Baker Bon Schermerhorn notified of admission and plan of care  2. Fetal Well being  - Fetal Tracing: Category I tracing - Group B Streptococcus ppx indicated:  GBS positive, treat with PCN - Presentation: vertex confirmed by bedside US    3. Routine OB: - Prenatal labs reviewed, as above - Rh positive - CBC, T&S, RPR on admit - Clear fluids, IVF  4. Induction of Labor -  Contractions q2-18min, external toco in place -  Pelvis proven to 3280g, adequate for trial of labor -  Plan for induction with pitocin  and AROM -  Plan for continuous fetal monitoring  -  Maternal pain control as desired; requesting un-medicated comfort measures but open to epidural - Anticipate vaginal delivery  5. Post Partum Planning: - Infant feeding: formula - Contraception: OCPs - Tdap: declined - Flu: declined  Auston Left, CNM 12/28/23 10:21 AM

## 2023-12-28 NOTE — Anesthesia Preprocedure Evaluation (Signed)
 Anesthesia Evaluation  Patient identified by MRN, date of birth, ID band Patient awake    Reviewed: Allergy & Precautions, NPO status , Patient's Chart, lab work & pertinent test results  History of Anesthesia Complications Negative for: history of anesthetic complications  Airway Mallampati: III   Neck ROM: Full    Dental   Pulmonary former smoker   Pulmonary exam normal breath sounds clear to auscultation       Cardiovascular Exercise Tolerance: Good negative cardio ROS Normal cardiovascular exam Rhythm:Regular Rate:Normal     Neuro/Psych  PSYCHIATRIC DISORDERS  Depression    negative neurological ROS     GI/Hepatic negative GI ROS,,,  Endo/Other  Obesity   Renal/GU negative Renal ROS     Musculoskeletal   Abdominal   Peds  Hematology  (+) Blood dyscrasia, anemia   Anesthesia Other Findings 28 yo G6P5005 at 54 3/7 with polyhydramnios requesting labor epidural.  Reproductive/Obstetrics (+) Pregnancy                             Anesthesia Physical Anesthesia Plan  ASA: 2  Anesthesia Plan: Epidural   Post-op Pain Management:    Induction:   PONV Risk Score and Plan: 2 and Treatment may vary due to age or medical condition  Airway Management Planned: Natural Airway  Additional Equipment:   Intra-op Plan:   Post-operative Plan:   Informed Consent: I have reviewed the patients History and Physical, chart, labs and discussed the procedure including the risks, benefits and alternatives for the proposed anesthesia with the patient or authorized representative who has indicated his/her understanding and acceptance.     Dental Advisory Given  Plan Discussed with:   Anesthesia Plan Comments: (Patient reports no bleeding problems and no anticoagulant use.   Patient consented for risks of anesthesia including but not limited to:  - adverse reactions to medications - risk of  bleeding, infection and or nerve damage from epidural that could lead to paralysis - risk of headache or failed epidural - nerve damage due to positioning - that if epidural is used for C-section that there is a chance of epidural failure requiring spinal placement or conversion to GA - damage to heart, brain, lungs, other parts of body or loss of life  Patient voiced understanding and assent.)       Anesthesia Quick Evaluation

## 2023-12-28 NOTE — Progress Notes (Signed)
 Labor Progress Note  Audrey Beard is a 28 y.o. [id number] at [redacted]w[redacted]d by LMP admitted for induction of labor due to unstable lie of the fetus, increased AFI, and social factors .  Subjective: she is comfortable after her epidural, feeling intermittent pressure   Objective: BP 116/62   Pulse 88   Temp 98 F (36.7 C) (Oral)   Resp 18   Ht 5\' 3"  (1.6 m)   Wt 84.8 kg   LMP 03/27/2023 (Exact Date)   SpO2 97%   BMI 33.13 kg/m  Vitals:   12/28/23 1755 12/28/23 1800  BP:  116/62  Pulse:  88  Resp:    Temp:  98 F (36.7 C)  SpO2: 97% 97%    Notable VS details: reviewed, has received several doses ephedrine  for hypotension after the epidural  Fetal Assessment: FHT:  FHR: 130 bpm, variability: moderate,  accelerations:  Present,  decelerations:  Absent Category/reactivity:  Category I UC:   regular, every 3-5 minutes SVE:    Dilation: 7cm  Effacement: 90%  Station:  -1  Consistency: medium  Position: middle  Membrane status:AROM @ 1407 Amniotic color: clear  Labs: Lab Results  Component Value Date   WBC 12.2 (H) 12/28/2023   HGB 9.2 (L) 12/28/2023   HCT 28.5 (L) 12/28/2023   MCV 86.4 12/28/2023   PLT 267 12/28/2023    Assessment / Plan: Audrey Beard at [redacted]w[redacted]d here with IOL for unstable lie, increased AFI, and social factors, with good labor progress  Labor: Progressing normally, pitocin  had been turned off at 1655 d/t fetal heart rate decelerations from hypotension after epidural placement. Pitocin  restarted at 1719 and titrated to 4mu/min. Good labor progress. Continue pitocin  titration. Preeclampsia:  labs stable Fetal Wellbeing:  Category II -> Category I, had experienced recurrent late decelerations after epidural placement d/t hypotension. Resolved with ephedrine , now Category I tracing. Pain Control:  Epidural, anesthesia notified and aware of hypotension and ephedrine  use I/D:  GBS positive, treated with PCN x 3 Anticipated MOD:  NSVD  Auston Left, CNM 12/28/2023, 6:41 Beard

## 2023-12-28 NOTE — Anesthesia Procedure Notes (Signed)
 Epidural Patient location during procedure: OB Start time: 12/28/2023 4:36 PM End time: 12/28/2023 4:43 PM  Staffing Anesthesiologist: Zakk Borgen, MD Performed: anesthesiologist   Preanesthetic Checklist Completed: patient identified, IV checked, risks and benefits discussed, surgical consent, monitors and equipment checked, pre-op evaluation and timeout performed  Epidural Patient position: sitting Prep: ChloraPrep Patient monitoring: heart rate, continuous pulse ox and blood pressure Approach: midline Location: L2-L3 Injection technique: LOR air  Needle:  Needle type: Tuohy  Needle gauge: 17 G Needle length: 9 cm Needle insertion depth: 7 cm Catheter at skin depth: 12 cm Test dose: negative and 1.5% lidocaine  with Epi 1:200 K  Assessment Sensory level: T4  Additional Notes Straightforward placement without apparent complications. Reason for block:procedure for pain

## 2023-12-28 NOTE — Progress Notes (Addendum)
 Labor Progress Note  Audrey Beard is a 28 y.o. [id number] at [redacted]w[redacted]d by LMP admitted for induction of labor due to unstable lie of the fetus, increased AFI, and social factors.  Subjective: she is starting to feel her contractions but rates them as "mild."  Objective: BP 117/66 (BP Location: Left Arm)   Pulse 98   Temp 98.3 F (36.8 C) (Oral)   Resp 18   Ht 5\' 3"  (1.6 m)   Wt 84.8 kg   LMP 03/27/2023 (Exact Date)   BMI 33.13 kg/m  Notable VS details: reviewed  Fetal Assessment: FHT:  FHR: 135 bpm, variability: moderate,  accelerations:  Present,  decelerations:  Absent Category/reactivity:  Category I UC:   regular, every 2-3 minutes SVE:    Dilation: 4cm  Effacement: 60%  Station:  -2  Consistency: medium  Position: middle  Membrane status:AROM with FSE @ 1407 Amniotic color: clear  Labs: Lab Results  Component Value Date   WBC 12.2 (H) 12/28/2023   HGB 9.2 (L) 12/28/2023   HCT 28.5 (L) 12/28/2023   MCV 86.4 12/28/2023   PLT 267 12/28/2023    Assessment / Plan: 28 year old G6P5005 at [redacted]w[redacted]d here with IOL for unstable lie, increased AFI, and social factors, with good labor progress  Labor: Progressing normally on pitocin , pitocin  currently at 39mu/min, confirmed fetal position as vertex with BSUS then AROM using FSE to avoid large gush d/t polyhydramnios, clear fluid, patient and fetus tolerated AROM well, continue pitocin  augmentation Preeclampsia:  labs stable Fetal Wellbeing:  Category I Pain Control:  Labor support without medications, worried about the pain so is considering epidural I/D:  GBS positive, second dose of PCN started, now adequately treated Anticipated MOD:  NSVD  Auston Left, CNM 12/28/2023, 2:20 PM

## 2023-12-28 NOTE — Progress Notes (Signed)
 ANMD notified of patient's persistent hypotention despite medication and fluid intervention post-epidural. ANMD verbal order given to give another 160 mcg of phenylephrine  and pause epidural for 30 min. After 30 min, restart rate at 8 ml/hr.   Patient and CNM notified.

## 2023-12-29 LAB — CBC
HCT: 26.2 % — ABNORMAL LOW (ref 36.0–46.0)
Hemoglobin: 8.4 g/dL — ABNORMAL LOW (ref 12.0–15.0)
MCH: 28.1 pg (ref 26.0–34.0)
MCHC: 32.1 g/dL (ref 30.0–36.0)
MCV: 87.6 fL (ref 80.0–100.0)
Platelets: 227 10*3/uL (ref 150–400)
RBC: 2.99 MIL/uL — ABNORMAL LOW (ref 3.87–5.11)
RDW: 14.6 % (ref 11.5–15.5)
WBC: 13.3 10*3/uL — ABNORMAL HIGH (ref 4.0–10.5)
nRBC: 0.2 % (ref 0.0–0.2)

## 2023-12-29 LAB — RPR: RPR Ser Ql: NONREACTIVE

## 2023-12-29 NOTE — Progress Notes (Signed)
 Postpartum Day  1  Subjective: 28 y.o. [id number] postpartum day #1 status post normal spontaneous vaginal delivery. She is ambulating, is tolerating po, is voiding spontaneously.  Her pain is well controlled on PO pain medications. Her lochia is less than menses.  Objective: BP 115/64 (BP Location: Left Arm)   Pulse 77   Temp 98.3 F (36.8 C) (Oral)   Resp 16   Ht 5\' 3"  (1.6 m)   Wt 84.8 kg   LMP 03/27/2023 (Exact Date)   SpO2 98%   Breastfeeding Unknown   BMI 33.13 kg/m    Physical Exam:  General: alert, cooperative, and appears stated age Breasts: soft/nontender Pulm: nl effort Abdomen: soft, non-tender, active bowel sounds Uterine Fundus: firm Perineum: minimal edema, intact Lochia: appropriate DVT Evaluation: No evidence of DVT seen on physical exam. Negative Homan's sign. No cords or calf tenderness. No significant calf/ankle edema.  Recent Labs    12/28/23 0844 12/29/23 0535  HGB 9.2* 8.4*  HCT 28.5* 26.2*  WBC 12.2* 13.3*  PLT 267 227    Assessment/Plan: 27 y.o. [id number] Postpartum Day  1  1. Continue routine postpartum care  2. Infant feeding status: formula feeding --Lactation consult PRN for breastfeeding    3. Contraception plan: undecided  4. Acute blood loss anemia - clinically significant.  --Hemodynamically stable and asymptomatic --Intervention: start on oral supplementation with ferrous sulfate  325 mg  and IV iron transfusion with venofer given   5. Immunization status:   all immunizations up to date   Disposition: continue inpatient postpartum care , plan for discharge home tomorrow    LOS: 1 day   Domenique Quest, CNM 12/29/2023, 6:24 PM   ----- Arzella Laurence Certified Nurse Midwife [facility]

## 2023-12-29 NOTE — Anesthesia Postprocedure Evaluation (Signed)
 Anesthesia Post Note  Patient: Audrey Beard  Procedure(s) Performed: AN AD HOC LABOR EPIDURAL  Patient location during evaluation: Mother Baby Anesthesia Type: Epidural Level of consciousness: awake and alert Pain management: pain level controlled Vital Signs Assessment: post-procedure vital signs reviewed and stable Respiratory status: spontaneous breathing, nonlabored ventilation and respiratory function stable Cardiovascular status: stable Postop Assessment: no headache, no backache and epidural receding Anesthetic complications: no   No notable events documented.   Last Vitals:  Vitals:   12/28/23 2359 12/29/23 0310  BP: 123/72 106/73  Pulse: 90 76  Resp: 18 18  Temp: 36.8 C (!) 36.4 C  SpO2: 97% 99%    Last Pain:  Vitals:   12/29/23 0310  TempSrc: Oral  PainSc:                  Iven Mark
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
## 2023-12-30 NOTE — Discharge Instructions (Signed)
No Driving   Intensive Outpatient Programs   Pensions consultant Health Services [facility] [address] [place],  [place], [place] [phone]  [facility] (Inpatient and outpatient)[phone] (Suboxone and Methadone) [address] [phone]  ADS: Alcohol & Drug [facility] Programs - Intensive Outpatient [address] [place], [place] [address], [place]  [phone]  [facility] (Outpatient, Inpatient, [facility] (Groups and Residental) (insurance only) [phone] [place], [place] [phone]   [facility] (for caregivers and family) [address] Laurell Josephs [address], [place], [place] [phone]  Residential Treatment Programs  [facility](2 years) Residential: 2 days)[facility] ([facility] [address] [place], [place], [place] [phone] or [phone]  [facility] [address] [place], [place], [place] [phone]  [facility] Facility[facility] Services ([facility]) [address] [place], [place], [place] [phone] Admissions: 8am-3pm M-F  [facility] Program: Residential Program [phone] Days)             [facility]: [facility]  [place], [place], [place]  [phone] or [phone] in Hours over the weekend or by referral)  [facility] [address] [place], [place] [address] [phone] (Do virtual or phone assessment, offer transportation within 25 miles, have in patient and Outpatient options)   Mobil Crisis: [facility]:[phone]  (for crisis response 24 hours a day)      Rent/Utility/Housing  Agency Name: [facility] Address: [address] [place], [place] [address] Phone: [phone] Email: [email or web address] .[email or web address] Website: www.alamanceservices.org Service(s) Offered: Housing services, self-sufficiency, congregate meal program, weatherization program, Field seismologist program, emergency food assistance,  housing counseling, home ownership program, wheels -towork program.  Agency Name: [facility] Address: [address], [place], [place] [address] Phone: [phone] (8a-4p) [phone] (8p- 10p) Email: [email or web address] Website: [email or web address].[email or web address].org Service(s) Offered: A program for homeless and/or needy men that includes one-on-one counseling, life skills training and job rehabilitation.  Agency Name: [facility] [place] Address: [address] [place], [place] [address] Phone: [phone] Website: www.alliedchurches.org Service(s) Offered: Assistance to needy in emergency with utility bills, heating fuel, and prescriptions. Shelter for homeless 7pm-7am. November 26, 2016 15  Agency Name: [facility] [place] (Developmentally Disabled) Address: [address], [place], [place] [address] Phone: [phone] Contact Person: Cathleen Corti Email: [email or web address] Website: LinkWedding.ca Service(s) Offered: Helps individuals with developmental disabilities move from housing that is more restrictive to homes where they  can achieve greater independence and have more  opportunities.  Agency Name: [facility] Address: [address], [place], [place] [address] Phone: [phone] Email: [email or web address] Website: www.burlingtonhousingauthority.org Service(s) Offered: Provides affordable housing for low-income families, elderly, and disabled individuals. Offer a wide range of  programs  and services, from financial planning to afterschool and summer programs.  Agency Name: Department of Social Services Address: [address] [place], [place] [address] Phone: [phone] Service(s) Offered: Child support services; child welfare services; food stamps; Medicaid; work first family assistance; and aid with fuel,  rent, food and medicine.  Agency Name: [facility] [place] [employer]. Address: [facility] [address] [place], [place]  [address] Phone: [phone] Website: www.familyabuseservices.org Service(s) Offered: 24 hour Crisis Line: [phone]; 24 hour Emergency Shelter; Transitional Housing; Support Groups; Scientist, physiological; [employer]; Hispanic  Outreach: [phone];  Visitation Center: [phone].  Agency Name: [facility] [place], [place]. Address: [address] [place], [place] [address] Phone: [phone] Service(s) Offered: CAP Services; Home and [employer]; Individual or Group Supports; Respite Care Non-Institutional Nursing;  Residential Supports; Respite Care and Personal Care Services; Transportation; Family and Friends Night; Recreational Activities; Three Nutritious Meals/Snacks; Consultation with Registered Dietician; Twenty-four hour Registered Nurse Access; Daily and [employer]; Camp Green Leaves; Bryant for the [employer] (During Summer Months) Bingo Night (Every  Wednesday Night); Special Populations Dance Night  (Every Tuesday Night); Professional Hair Care Services.  Agency Name: [facility] Address: [address], [place], [place] [address] Phone: [phone] Contact Person: Jabier Mutton Website: [email or web address].Physicist, medical) Offered: Residential treatment facility for women; food and  clothing, educational & employment development and  transportation to work; Counsellor of financial skills;  parenting and family  reunification; emotional and spiritual  support; transitional housing for program graduates.  Agency Name: [facility] Address: [address] [place], [place] [address] Phone: [phone] Email: [email or web address] Website: TaskTown.es Service(s) Offered: Public housing units for elderly, disabled, and low income people; housing choice vouchers for income eligible  applicants; shelter plus care vouchers; and Psychologist, clinical.  Agency Name: [facility] [employer] Address: [address], [place], [place] [address] Phone: [phone] Email: habitat1@netzero .net Website: www.habitatalamance.org Service(s) Offered: Build houses for families in need of decent housing. Each adult in the family must invest 200 hours of labor on  someone else's house, work with volunteers to build their own house, attend classes on budgeting, home maintenance, yard care, and attend homeowner association meetings.  Agency Name: [facility] Address: [address] [place], [place] [address] Phone: [phone] Website: www.rsli.org Service(s) Offered: Intermediate care facilities for intellectually delayed, Supervised Living in group homes for adults with developmental disabilities, Supervised Living for people who have dual diagnoses (MRMI), Independent Living, Supported Living, respite and a variety of CAP services, pre-vocational services, day supports, and [employer].  Agency Name: [place] [employer] Phone: [phone] Website: www.NCForeclosurePrevention.gov Service(s) Offered: Zero-interest, deferred loans to homeowners struggling to pay their mortgage. Call for more information.

## 2023-12-30 NOTE — Progress Notes (Signed)
 Pt discharged with infant.  Discharge instructions, prescriptions and follow up appointment given to and reviewed with pt. Pt verbalized understanding. Escorted out by auxillary.

## 2024-01-27 ENCOUNTER — Inpatient Hospital Stay: Admission: RE | Admit: 2024-01-27 | Payer: Self-pay | Source: Home / Self Care | Admitting: Obstetrics and Gynecology

## 2024-04-09 ENCOUNTER — Encounter: Payer: Self-pay | Admitting: *Deleted

## 2024-04-09 ENCOUNTER — Emergency Department
Admission: EM | Admit: 2024-04-09 | Discharge: 2024-04-09 | Disposition: A | Discharge status: Home / Self Care | Attending: Emergency Medicine | Admitting: Emergency Medicine

## 2024-04-09 ENCOUNTER — Other Ambulatory Visit: Payer: Self-pay

## 2024-04-09 DIAGNOSIS — Y92810 Car as the place of occurrence of the external cause: Secondary | ICD-10-CM | POA: Diagnosis not present

## 2024-04-09 DIAGNOSIS — S8991XA Unspecified injury of right lower leg, initial encounter: Secondary | ICD-10-CM | POA: Diagnosis present

## 2024-04-09 DIAGNOSIS — S8001XA Contusion of right knee, initial encounter: Secondary | ICD-10-CM | POA: Diagnosis not present

## 2024-04-09 DIAGNOSIS — S8002XA Contusion of left knee, initial encounter: Secondary | ICD-10-CM | POA: Diagnosis not present

## 2024-04-09 DIAGNOSIS — W500XXA Accidental hit or strike by another person, initial encounter: Secondary | ICD-10-CM | POA: Insufficient documentation

## 2024-04-09 DIAGNOSIS — S8000XA Contusion of unspecified knee, initial encounter: Secondary | ICD-10-CM

## 2024-04-09 DIAGNOSIS — T07XXXA Unspecified multiple injuries, initial encounter: Secondary | ICD-10-CM

## 2024-04-09 DIAGNOSIS — M25532 Pain in left wrist: Secondary | ICD-10-CM | POA: Insufficient documentation

## 2024-04-09 MED ORDER — BACITRACIN ZINC 500 UNIT/GM EX OINT
TOPICAL_OINTMENT | Freq: Two times a day (BID) | CUTANEOUS | Status: DC
  Administered 2024-04-09: 1 via TOPICAL
  Filled 2024-04-09: qty 0.9
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
## 2024-04-09 NOTE — ED Notes (Signed)
 Pt ambulated to room, once in room, dressings were removed from bilateral knees, and also, noted that pt had abrasions and bruising to the left lower abdominal area.
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
## 2024-04-09 NOTE — Discharge Instructions (Addendum)
 You can expect to be more sore tomorrow than you are right now.  This will likely last for a few days.  You can shower or bath normally, and we encourage you to gentle wash your knee abrasions to keep them clean.  You can use bacitracin  or similar antibiotic ointment on them twice daily and cover them with clean gauze.  You can use your left wrist as much as you are able, but if it continues to hurt, look through the included information about RICE care (Rest, Ice, Compression, and Elevation).  Please follow up with your regular doctor as needed for follow up.

## 2024-07-06 ENCOUNTER — Ambulatory Visit

## 2024-08-31 ENCOUNTER — Encounter
